# Patient Record
Sex: Female | Born: 1960 | Race: Black or African American | Hispanic: No | Marital: Married | Smoking: Never smoker
Health system: Southern US, Community
[De-identification: ages and names within clinical notes are randomized; demographics above are authoritative.]

## PROBLEM LIST (undated history)

## (undated) ENCOUNTER — Ambulatory Visit: Admission: EM | Payer: BC Managed Care – PPO | Source: Home / Self Care

## (undated) DIAGNOSIS — I491 Atrial premature depolarization: Secondary | ICD-10-CM

## (undated) DIAGNOSIS — G4451 Hemicrania continua: Secondary | ICD-10-CM

## (undated) DIAGNOSIS — I493 Ventricular premature depolarization: Secondary | ICD-10-CM

## (undated) DIAGNOSIS — E079 Disorder of thyroid, unspecified: Secondary | ICD-10-CM

## (undated) DIAGNOSIS — I471 Supraventricular tachycardia, unspecified: Secondary | ICD-10-CM

## (undated) DIAGNOSIS — E785 Hyperlipidemia, unspecified: Secondary | ICD-10-CM

## (undated) DIAGNOSIS — Z460 Encounter for fitting and adjustment of spectacles and contact lenses: Secondary | ICD-10-CM

## (undated) DIAGNOSIS — R51 Headache: Secondary | ICD-10-CM

## (undated) DIAGNOSIS — E0590 Thyrotoxicosis, unspecified without thyrotoxic crisis or storm: Secondary | ICD-10-CM

## (undated) DIAGNOSIS — D509 Iron deficiency anemia, unspecified: Secondary | ICD-10-CM

## (undated) DIAGNOSIS — D219 Benign neoplasm of connective and other soft tissue, unspecified: Secondary | ICD-10-CM

## (undated) DIAGNOSIS — E669 Obesity, unspecified: Secondary | ICD-10-CM

## (undated) DIAGNOSIS — S161XXA Strain of muscle, fascia and tendon at neck level, initial encounter: Secondary | ICD-10-CM

## (undated) DIAGNOSIS — E559 Vitamin D deficiency, unspecified: Secondary | ICD-10-CM

## (undated) DIAGNOSIS — Z789 Other specified health status: Secondary | ICD-10-CM

## (undated) HISTORY — DX: Benign neoplasm of connective and other soft tissue, unspecified: D21.9

## (undated) HISTORY — DX: Hemicrania continua: G44.51

## (undated) HISTORY — DX: Iron deficiency anemia, unspecified: D50.9

## (undated) HISTORY — DX: Supraventricular tachycardia: I47.1

## (undated) HISTORY — DX: Vitamin D deficiency, unspecified: E55.9

## (undated) HISTORY — PX: TUBAL LIGATION: SHX77

## (undated) HISTORY — DX: Obesity, unspecified: E66.9

## (undated) HISTORY — DX: Disorder of thyroid, unspecified: E07.9

## (undated) HISTORY — DX: Hyperlipidemia, unspecified: E78.5

## (undated) HISTORY — DX: Thyrotoxicosis, unspecified without thyrotoxic crisis or storm: E05.90

## (undated) HISTORY — DX: Ventricular premature depolarization: I49.3

## (undated) HISTORY — DX: Atrial premature depolarization: I49.1

## (undated) HISTORY — DX: Supraventricular tachycardia, unspecified: I47.10

## (undated) HISTORY — PX: GANGLION CYST EXCISION: SHX1691

---

## 1998-01-16 ENCOUNTER — Other Ambulatory Visit: Admission: RE | Admit: 1998-01-16 | Discharge: 1998-01-16 | Payer: Self-pay | Admitting: *Deleted

## 1998-01-23 ENCOUNTER — Other Ambulatory Visit: Admission: RE | Admit: 1998-01-23 | Discharge: 1998-01-23 | Payer: Self-pay | Admitting: *Deleted

## 1998-02-13 ENCOUNTER — Other Ambulatory Visit: Admission: RE | Admit: 1998-02-13 | Discharge: 1998-02-13 | Payer: Self-pay | Admitting: *Deleted

## 1998-04-29 ENCOUNTER — Encounter: Admission: RE | Admit: 1998-04-29 | Discharge: 1998-04-29 | Payer: Self-pay | Admitting: *Deleted

## 1998-12-23 ENCOUNTER — Other Ambulatory Visit: Admission: RE | Admit: 1998-12-23 | Discharge: 1998-12-23 | Payer: Self-pay | Admitting: *Deleted

## 1999-04-06 ENCOUNTER — Emergency Department (HOSPITAL_COMMUNITY): Admission: EM | Admit: 1999-04-06 | Discharge: 1999-04-06 | Payer: Self-pay | Admitting: Emergency Medicine

## 1999-04-06 ENCOUNTER — Encounter: Payer: Self-pay | Admitting: Emergency Medicine

## 1999-04-13 ENCOUNTER — Encounter: Payer: Self-pay | Admitting: *Deleted

## 1999-04-13 ENCOUNTER — Ambulatory Visit (HOSPITAL_COMMUNITY): Admission: RE | Admit: 1999-04-13 | Discharge: 1999-04-13 | Payer: Self-pay | Admitting: *Deleted

## 2000-02-08 ENCOUNTER — Other Ambulatory Visit: Admission: RE | Admit: 2000-02-08 | Discharge: 2000-02-08 | Payer: Self-pay | Admitting: *Deleted

## 2000-06-05 ENCOUNTER — Emergency Department (HOSPITAL_COMMUNITY): Admission: EM | Admit: 2000-06-05 | Discharge: 2000-06-05 | Payer: Self-pay | Admitting: Emergency Medicine

## 2000-11-25 ENCOUNTER — Emergency Department (HOSPITAL_COMMUNITY): Admission: EM | Admit: 2000-11-25 | Discharge: 2000-11-25 | Payer: Self-pay | Admitting: *Deleted

## 2001-01-24 ENCOUNTER — Ambulatory Visit (HOSPITAL_COMMUNITY): Admission: RE | Admit: 2001-01-24 | Discharge: 2001-01-24 | Payer: Self-pay | Admitting: *Deleted

## 2001-01-24 ENCOUNTER — Encounter: Payer: Self-pay | Admitting: *Deleted

## 2001-04-05 ENCOUNTER — Other Ambulatory Visit: Admission: RE | Admit: 2001-04-05 | Discharge: 2001-04-05 | Payer: Self-pay | Admitting: *Deleted

## 2002-01-31 ENCOUNTER — Encounter: Payer: Self-pay | Admitting: *Deleted

## 2002-01-31 ENCOUNTER — Ambulatory Visit (HOSPITAL_COMMUNITY): Admission: RE | Admit: 2002-01-31 | Discharge: 2002-01-31 | Payer: Self-pay | Admitting: *Deleted

## 2002-04-08 ENCOUNTER — Other Ambulatory Visit: Admission: RE | Admit: 2002-04-08 | Discharge: 2002-04-08 | Payer: Self-pay | Admitting: *Deleted

## 2002-09-05 HISTORY — PX: SHOULDER ARTHROSCOPY: SHX128

## 2003-04-04 ENCOUNTER — Ambulatory Visit (HOSPITAL_BASED_OUTPATIENT_CLINIC_OR_DEPARTMENT_OTHER): Admission: RE | Admit: 2003-04-04 | Discharge: 2003-04-04 | Payer: Self-pay | Admitting: Orthopedic Surgery

## 2003-04-08 ENCOUNTER — Ambulatory Visit (HOSPITAL_COMMUNITY): Admission: RE | Admit: 2003-04-08 | Discharge: 2003-04-08 | Payer: Self-pay | Admitting: *Deleted

## 2003-04-08 ENCOUNTER — Encounter: Payer: Self-pay | Admitting: *Deleted

## 2003-06-03 ENCOUNTER — Other Ambulatory Visit: Admission: RE | Admit: 2003-06-03 | Discharge: 2003-06-03 | Payer: Self-pay | Admitting: Obstetrics and Gynecology

## 2004-04-19 ENCOUNTER — Ambulatory Visit (HOSPITAL_COMMUNITY): Admission: RE | Admit: 2004-04-19 | Discharge: 2004-04-19 | Payer: Self-pay | Admitting: Obstetrics and Gynecology

## 2004-08-08 ENCOUNTER — Emergency Department (HOSPITAL_COMMUNITY): Admission: EM | Admit: 2004-08-08 | Discharge: 2004-08-08 | Payer: Self-pay | Admitting: Family Medicine

## 2004-12-10 ENCOUNTER — Emergency Department (HOSPITAL_COMMUNITY): Admission: EM | Admit: 2004-12-10 | Discharge: 2004-12-10 | Payer: Self-pay | Admitting: Family Medicine

## 2005-04-07 ENCOUNTER — Emergency Department (HOSPITAL_COMMUNITY): Admission: EM | Admit: 2005-04-07 | Discharge: 2005-04-07 | Payer: Self-pay | Admitting: Emergency Medicine

## 2005-04-21 ENCOUNTER — Ambulatory Visit (HOSPITAL_COMMUNITY): Admission: RE | Admit: 2005-04-21 | Discharge: 2005-04-21 | Payer: Self-pay | Admitting: Obstetrics and Gynecology

## 2005-09-02 ENCOUNTER — Encounter: Admission: RE | Admit: 2005-09-02 | Discharge: 2005-09-02 | Payer: Self-pay | Admitting: Obstetrics and Gynecology

## 2006-01-06 ENCOUNTER — Emergency Department (HOSPITAL_COMMUNITY): Admission: EM | Admit: 2006-01-06 | Discharge: 2006-01-06 | Payer: Self-pay | Admitting: Family Medicine

## 2006-06-10 ENCOUNTER — Emergency Department (HOSPITAL_COMMUNITY): Admission: EM | Admit: 2006-06-10 | Discharge: 2006-06-10 | Payer: Self-pay | Admitting: Emergency Medicine

## 2006-06-21 ENCOUNTER — Emergency Department (HOSPITAL_COMMUNITY): Admission: EM | Admit: 2006-06-21 | Discharge: 2006-06-21 | Payer: Self-pay | Admitting: Family Medicine

## 2006-09-14 ENCOUNTER — Ambulatory Visit (HOSPITAL_COMMUNITY): Admission: RE | Admit: 2006-09-14 | Discharge: 2006-09-14 | Payer: Self-pay | Admitting: Obstetrics and Gynecology

## 2007-04-11 ENCOUNTER — Other Ambulatory Visit: Admission: RE | Admit: 2007-04-11 | Discharge: 2007-04-11 | Payer: Self-pay | Admitting: *Deleted

## 2007-05-27 ENCOUNTER — Emergency Department (HOSPITAL_COMMUNITY): Admission: EM | Admit: 2007-05-27 | Discharge: 2007-05-27 | Payer: Self-pay | Admitting: Family Medicine

## 2007-07-06 ENCOUNTER — Emergency Department (HOSPITAL_COMMUNITY): Admission: EM | Admit: 2007-07-06 | Discharge: 2007-07-06 | Payer: Self-pay | Admitting: Family Medicine

## 2007-10-01 ENCOUNTER — Ambulatory Visit (HOSPITAL_COMMUNITY): Admission: RE | Admit: 2007-10-01 | Discharge: 2007-10-01 | Payer: Self-pay | Admitting: *Deleted

## 2007-12-24 ENCOUNTER — Emergency Department (HOSPITAL_COMMUNITY): Admission: EM | Admit: 2007-12-24 | Discharge: 2007-12-24 | Payer: Self-pay | Admitting: Emergency Medicine

## 2008-05-21 ENCOUNTER — Other Ambulatory Visit: Admission: RE | Admit: 2008-05-21 | Discharge: 2008-05-21 | Payer: Self-pay | Admitting: Family Medicine

## 2008-10-02 ENCOUNTER — Ambulatory Visit (HOSPITAL_COMMUNITY): Admission: RE | Admit: 2008-10-02 | Discharge: 2008-10-02 | Payer: Self-pay | Admitting: Family Medicine

## 2009-06-08 ENCOUNTER — Other Ambulatory Visit: Admission: RE | Admit: 2009-06-08 | Discharge: 2009-06-08 | Payer: Self-pay | Admitting: Family Medicine

## 2009-10-14 ENCOUNTER — Ambulatory Visit (HOSPITAL_COMMUNITY): Admission: RE | Admit: 2009-10-14 | Discharge: 2009-10-14 | Payer: Self-pay | Admitting: Family Medicine

## 2010-06-18 ENCOUNTER — Other Ambulatory Visit: Admission: RE | Admit: 2010-06-18 | Discharge: 2010-06-18 | Payer: Self-pay | Admitting: Family Medicine

## 2010-10-11 ENCOUNTER — Other Ambulatory Visit: Payer: Self-pay | Admitting: Family Medicine

## 2010-10-11 DIAGNOSIS — Z1239 Encounter for other screening for malignant neoplasm of breast: Secondary | ICD-10-CM

## 2010-10-11 DIAGNOSIS — Z1231 Encounter for screening mammogram for malignant neoplasm of breast: Secondary | ICD-10-CM

## 2010-10-21 ENCOUNTER — Ambulatory Visit: Payer: Self-pay

## 2010-10-28 ENCOUNTER — Ambulatory Visit (HOSPITAL_COMMUNITY): Payer: BC Managed Care – PPO | Attending: Family Medicine

## 2010-11-22 ENCOUNTER — Other Ambulatory Visit (HOSPITAL_COMMUNITY): Payer: Self-pay | Admitting: Family Medicine

## 2010-11-22 DIAGNOSIS — Z1231 Encounter for screening mammogram for malignant neoplasm of breast: Secondary | ICD-10-CM

## 2010-12-01 ENCOUNTER — Ambulatory Visit (HOSPITAL_COMMUNITY): Payer: BC Managed Care – PPO

## 2010-12-08 ENCOUNTER — Ambulatory Visit (HOSPITAL_COMMUNITY)
Admission: RE | Admit: 2010-12-08 | Discharge: 2010-12-08 | Disposition: A | Discharge status: Home / Self Care | Payer: BC Managed Care – PPO | Source: Ambulatory Visit | Attending: Family Medicine | Admitting: Family Medicine

## 2010-12-08 DIAGNOSIS — Z1231 Encounter for screening mammogram for malignant neoplasm of breast: Secondary | ICD-10-CM

## 2011-01-21 NOTE — Op Note (Signed)
NAME:  Yvette Stanton, MEDICO                         ACCOUNT NO.:  [id number]   MEDICAL RECORD NO.:  [id number]                   PATIENT TYPE:  OUT   LOCATION:  [facility]                                 FACILITY:  [facility]   PHYSICIAN:  Harvie Junior, M.D.                DATE OF BIRTH:  05-02-1961   DATE OF PROCEDURE:  04/04/2003  DATE OF DISCHARGE:                                 OPERATIVE REPORT   PREOPERATIVE DIAGNOSIS:  Impingement right shoulder.   POSTOPERATIVE DIAGNOSES:  1. Impingement right shoulder.  2. Partial thickness undersurface rotator cuff tear.   OPERATION:  1. Anterior lateral acromioplasty.  2. Distal clavicle excision by way of coplaneing.  3. Debridement of the glenohumeral joint of a partial thickness rotator cuff     tear.   SURGEON:  Harvie Junior, M.D.   ASSISTANT:  Marshia Ly, P.A.   ANESTHESIA:  General.   BRIEF HISTORY:  The patient is a 50 year old female with a long history of  having right shoulder pain.  She has been treated conservatively for a  prolonged period of time but continued to have complaints of pain with over  head elevation and cross body adduction.  We treated her with physical  therapy and anti-inflammatory medications.  Injection therapy worked  Adult nurse for her but her pain recurred.  Because of the continued  complaint of pain and failure to conservative care she is taken to the  operating room for acromioplasty and evaluation of the shoulder.   DESCRIPTION OF PROCEDURE:  The patient was taken to the operating room and  after adequate anesthesia obtained with a general anesthetic, the patient  was placed in the beach chair positioner.  All bony prominences were well  padded.  Attention was then turned to the right shoulder.  After routine  prepping and draping, standard arthroscopic examination revealed that there  was an undersurface tear of the rotator cuff at the supraspinatus.  A  motorized shaver was introduced into the  glenohumeral joint at this point  and the undersurface of the rotator cuff was debrided.  The biceps tendon  was identified and noted within normal limits.  There was a little bit of  posterior labral fraying which was debrided.  The glenohumeral joint was  evaluated and noted to have no abnormality and after debridement of the  rotator cuff, the rotator cuff was evaluated thoroughly and was noted to be  within normal limits.  Attention at this time was turned out of the  glenohumeral joint into the subacromial space where a large subacromial spur  was encountered.  The anterolateral acromioplasty was performed as well as a  bursectomy.  The distal clavicle was coplaned up to the level of the  acromioplasty.  At this point, the rotator cuff was evaluated thoroughly  from the superior surface and noted to have no evidence of abnormality.  A  generous bursectomy  was performed from the superior surface.  At this time,  a final check was made for any loose fragmented pieces of bursal tissue or  periosteum.  This was debrided with a suction shaver from within the  superior surface and then the patient was taken to the recovery room where  she was noted to be in satisfactory condition after her portals were closed  with a bandage and a sterile compressive dressing had been applied.  There  were no complications with the procedure and the patient was taken to the  recovery room where she was noted to be in satisfactory condition.  Estimated blood loss for the procedure was none.                                               Harvie Junior, M.D.    Ranae Plumber  D:  04/04/2003  T:  04/04/2003  Job:  [id number]

## 2011-04-22 ENCOUNTER — Inpatient Hospital Stay (INDEPENDENT_AMBULATORY_CARE_PROVIDER_SITE_OTHER)
Admission: RE | Admit: 2011-04-22 | Discharge: 2011-04-22 | Disposition: A | Discharge status: Home / Self Care | Payer: BC Managed Care – PPO | Source: Ambulatory Visit | Attending: Emergency Medicine | Admitting: Emergency Medicine

## 2011-04-22 DIAGNOSIS — S335XXA Sprain of ligaments of lumbar spine, initial encounter: Secondary | ICD-10-CM

## 2011-04-22 DIAGNOSIS — M549 Dorsalgia, unspecified: Secondary | ICD-10-CM

## 2011-04-22 LAB — POCT URINALYSIS DIP (DEVICE)
Bilirubin Urine: NEGATIVE
Glucose, UA: NEGATIVE mg/dL
Hgb urine dipstick: NEGATIVE
Ketones, ur: NEGATIVE mg/dL
Leukocytes, UA: NEGATIVE
Nitrite: NEGATIVE
Protein, ur: NEGATIVE mg/dL
Specific Gravity, Urine: 1.02 (ref 1.005–1.030)
Urobilinogen, UA: 0.2 mg/dL (ref 0.0–1.0)
pH: 7.5 (ref 5.0–8.0)

## 2011-06-15 LAB — POCT URINALYSIS DIP (DEVICE)
Bilirubin Urine: NEGATIVE
Glucose, UA: NEGATIVE
Ketones, ur: NEGATIVE
Nitrite: NEGATIVE
Operator id: 116391
Protein, ur: NEGATIVE
Specific Gravity, Urine: 1.015
Urobilinogen, UA: 0.2
pH: 6.5

## 2011-06-16 LAB — POCT URINALYSIS DIP (DEVICE)
Bilirubin Urine: NEGATIVE
Glucose, UA: NEGATIVE
Ketones, ur: NEGATIVE
Nitrite: NEGATIVE
Operator id: 208841
Protein, ur: NEGATIVE
Specific Gravity, Urine: 1.01
Urobilinogen, UA: 0.2
pH: 7.5

## 2011-06-16 LAB — URINE CULTURE: Colony Count: 1000

## 2011-12-12 ENCOUNTER — Other Ambulatory Visit (HOSPITAL_COMMUNITY): Payer: Self-pay | Admitting: Family Medicine

## 2011-12-12 DIAGNOSIS — Z1231 Encounter for screening mammogram for malignant neoplasm of breast: Secondary | ICD-10-CM

## 2011-12-24 ENCOUNTER — Encounter (HOSPITAL_COMMUNITY): Payer: Self-pay

## 2011-12-24 ENCOUNTER — Emergency Department (HOSPITAL_COMMUNITY)
Admission: EM | Admit: 2011-12-24 | Discharge: 2011-12-24 | Disposition: A | Discharge status: Home / Self Care | Payer: BC Managed Care – PPO | Source: Home / Self Care | Attending: Emergency Medicine | Admitting: Emergency Medicine

## 2011-12-24 DIAGNOSIS — B9789 Other viral agents as the cause of diseases classified elsewhere: Secondary | ICD-10-CM

## 2011-12-24 DIAGNOSIS — B349 Viral infection, unspecified: Secondary | ICD-10-CM

## 2011-12-24 LAB — POCT URINALYSIS DIP (DEVICE)
Glucose, UA: NEGATIVE mg/dL
Ketones, ur: 40 mg/dL — AB
Leukocytes, UA: NEGATIVE
Nitrite: NEGATIVE
Protein, ur: 30 mg/dL — AB
Specific Gravity, Urine: 1.02 (ref 1.005–1.030)
Urobilinogen, UA: 1 mg/dL (ref 0.0–1.0)
pH: 6.5 (ref 5.0–8.0)

## 2011-12-24 MED ORDER — IBUPROFEN 800 MG PO TABS
ORAL_TABLET | ORAL | Status: AC
  Filled 2011-12-24: qty 1

## 2011-12-24 MED ORDER — IBUPROFEN 800 MG PO TABS
800.0000 mg | ORAL_TABLET | Freq: Once | ORAL | Status: AC
  Administered 2011-12-24: 800 mg via ORAL

## 2011-12-24 NOTE — ED Provider Notes (Signed)
Chief Complaint  Patient presents with  . Fever    History of Present Illness:   The patient is a 51 year old female who had a two-day history of a low-grade temperature up to 100.8, chills, sweats, aches, scratchy throat, and headache. She denies any nasal congestion, rhinorrhea, adenopathy, or stiff neck. She's had no coughing, wheezing, shortness of breath, or chest pain. She denies any abdominal pain, nausea, vomiting, or diarrhea. She had no urinary symptoms. She denies any GYN complaints. No history of a tick bite or skin rash. No recent travel, and will exposure, or any other suspicious exposures.  Review of Systems:  Other than noted above, the patient denies any of the following symptoms. Systemic:  No fever, chills, sweats, fatigue, myalgias, headache, or anorexia. Eye:  No redness, pain or drainage. ENT:  No earache, nasal congestion, rhinorrhea, sinus pressure, or sore throat. Lungs:  No cough, sputum production, wheezing, shortness of breath.  Cardiovascular:  No chest pain, palpitations, or syncope. GI:  No nausea, vomiting, abdominal pain or diarrhea. GU:  No dysuria, frequency, or hematuria. Skin:  No rash or pruritis.  PMFSH:  Past medical history, family history, social history, meds, and allergies were reviewed.  Physical Exam:   Vital signs:  BP 121/79  Pulse 110  Temp(Src) 100.8 F (38.2 C) (Oral)  Resp 20  SpO2 97%  LMP 12/19/2011 General:  Alert, in no distress. Eye:  PERRL, full EOMs.  Lids and conjunctivas were normal. ENT:  TMs and canals were normal, without erythema or inflammation.  Nasal mucosa was clear and uncongested, without drainage.  Mucous membranes were moist.  Pharynx was clear, without exudate or drainage.  There were no oral ulcerations or lesions. Neck:  Supple, no adenopathy, tenderness or mass. Thyroid was normal. Lungs:  No respiratory distress.  Lungs were clear to auscultation, without wheezes, rales or rhonchi.  Breath sounds were clear  and equal bilaterally. Heart:  Regular rhythm, without gallops, murmers or rubs. Abdomen:  Soft, flat, and non-tender to palpation.  No hepatosplenomagaly or mass. Skin:  Clear, warm, and dry, without rash or lesions.  Labs:   Results for orders placed during the hospital encounter of 12/24/11  POCT URINALYSIS DIP (DEVICE)      Component Value Range   Glucose, UA NEGATIVE  NEGATIVE (mg/dL)   Bilirubin Urine SMALL (*) NEGATIVE    Ketones, ur 40 (*) NEGATIVE (mg/dL)   Specific Gravity, Urine 1.020  1.005 - 1.030    Hgb urine dipstick TRACE (*) NEGATIVE    pH 6.5  5.0 - 8.0    Protein, ur 30 (*) NEGATIVE (mg/dL)   Urobilinogen, UA 1.0  0.0 - 1.0 (mg/dL)   Nitrite NEGATIVE  NEGATIVE    Leukocytes, UA NEGATIVE  NEGATIVE     Assessment:  The encounter diagnosis was Viral syndrome.  Plan:   1.  The following meds were prescribed:   New Prescriptions   No medications on file   2.  The patient was instructed in symptomatic care and handouts were given. 3.  The patient was told to return if becoming worse in any way, if no better in 3 or 4 days, and given some red flag symptoms that would indicate earlier return.    Reuben Likes, MD 12/24/11 [id number]

## 2011-12-24 NOTE — ED Notes (Signed)
Pt has been achy and fever since yesterday, she was also sick last weekend with similar symptoms.

## 2011-12-24 NOTE — Discharge Instructions (Signed)

## 2012-01-06 ENCOUNTER — Ambulatory Visit (HOSPITAL_COMMUNITY)
Admission: RE | Admit: 2012-01-06 | Discharge: 2012-01-06 | Disposition: A | Discharge status: Home / Self Care | Payer: BC Managed Care – PPO | Source: Ambulatory Visit | Attending: Family Medicine | Admitting: Family Medicine

## 2012-01-06 DIAGNOSIS — Z1231 Encounter for screening mammogram for malignant neoplasm of breast: Secondary | ICD-10-CM

## 2012-04-08 ENCOUNTER — Encounter (HOSPITAL_COMMUNITY): Payer: Self-pay | Admitting: *Deleted

## 2012-04-08 ENCOUNTER — Emergency Department (INDEPENDENT_AMBULATORY_CARE_PROVIDER_SITE_OTHER)
Admission: EM | Admit: 2012-04-08 | Discharge: 2012-04-08 | Disposition: A | Discharge status: Home / Self Care | Payer: BC Managed Care – PPO | Source: Home / Self Care | Attending: Emergency Medicine | Admitting: Emergency Medicine

## 2012-04-08 DIAGNOSIS — R51 Headache: Secondary | ICD-10-CM

## 2012-04-08 HISTORY — DX: Strain of muscle, fascia and tendon at neck level, initial encounter: S16.1XXA

## 2012-04-08 HISTORY — DX: Headache: R51

## 2012-04-08 MED ORDER — BUTALBITAL-APAP-CAFFEINE 50-325-40 MG PO TABS: 1.0000 | ORAL_TABLET | Freq: Four times a day (QID) | ORAL | Status: AC | PRN

## 2012-04-08 MED ORDER — KETOROLAC TROMETHAMINE 60 MG/2ML IM SOLN
INTRAMUSCULAR | Status: AC
  Filled 2012-04-08: qty 2

## 2012-04-08 MED ORDER — IBUPROFEN 800 MG PO TABS: 800.0000 mg | ORAL_TABLET | Freq: Three times a day (TID) | ORAL | Status: AC

## 2012-04-08 MED ORDER — KETOROLAC TROMETHAMINE 30 MG/ML IJ SOLN
60.0000 mg | Freq: Once | INTRAMUSCULAR | Status: AC
  Administered 2012-04-08: 60 mg via INTRAMUSCULAR
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
## 2012-04-08 NOTE — ED Notes (Signed)
Pt with throbbing headache right side of head onset x one week - worse today - taking otc medications without relief - chronic headaches worse last few days

## 2012-05-10 DIAGNOSIS — R519 Headache, unspecified: Secondary | ICD-10-CM | POA: Insufficient documentation

## 2012-12-04 ENCOUNTER — Other Ambulatory Visit: Payer: Self-pay | Admitting: Family Medicine

## 2012-12-04 DIAGNOSIS — N920 Excessive and frequent menstruation with regular cycle: Secondary | ICD-10-CM

## 2012-12-10 ENCOUNTER — Ambulatory Visit
Admission: RE | Admit: 2012-12-10 | Discharge: 2012-12-10 | Disposition: A | Discharge status: Home / Self Care | Payer: BC Managed Care – PPO | Source: Ambulatory Visit | Attending: Family Medicine | Admitting: Family Medicine

## 2012-12-10 DIAGNOSIS — N920 Excessive and frequent menstruation with regular cycle: Secondary | ICD-10-CM

## 2013-01-04 DIAGNOSIS — F439 Reaction to severe stress, unspecified: Secondary | ICD-10-CM | POA: Insufficient documentation

## 2013-01-21 ENCOUNTER — Other Ambulatory Visit (HOSPITAL_COMMUNITY): Payer: Self-pay | Admitting: Family Medicine

## 2013-01-21 DIAGNOSIS — Z1231 Encounter for screening mammogram for malignant neoplasm of breast: Secondary | ICD-10-CM

## 2013-01-23 ENCOUNTER — Ambulatory Visit (HOSPITAL_COMMUNITY): Payer: BC Managed Care – PPO | Attending: Family Medicine

## 2013-01-29 ENCOUNTER — Other Ambulatory Visit (HOSPITAL_COMMUNITY)
Admission: RE | Admit: 2013-01-29 | Discharge: 2013-01-29 | Disposition: A | Discharge status: Home / Self Care | Payer: BC Managed Care – PPO | Source: Ambulatory Visit | Attending: Obstetrics and Gynecology | Admitting: Obstetrics and Gynecology

## 2013-01-29 ENCOUNTER — Other Ambulatory Visit: Payer: Self-pay | Admitting: Obstetrics and Gynecology

## 2013-01-29 DIAGNOSIS — Z1151 Encounter for screening for human papillomavirus (HPV): Secondary | ICD-10-CM | POA: Insufficient documentation

## 2013-01-29 DIAGNOSIS — Z01419 Encounter for gynecological examination (general) (routine) without abnormal findings: Secondary | ICD-10-CM | POA: Insufficient documentation

## 2013-02-01 ENCOUNTER — Ambulatory Visit (HOSPITAL_COMMUNITY): Payer: BC Managed Care – PPO

## 2013-02-07 ENCOUNTER — Ambulatory Visit (HOSPITAL_COMMUNITY)
Admission: RE | Admit: 2013-02-07 | Discharge: 2013-02-07 | Disposition: A | Discharge status: Home / Self Care | Payer: BC Managed Care – PPO | Source: Ambulatory Visit | Attending: Family Medicine | Admitting: Family Medicine

## 2013-02-07 DIAGNOSIS — Z1231 Encounter for screening mammogram for malignant neoplasm of breast: Secondary | ICD-10-CM | POA: Insufficient documentation

## 2013-09-27 ENCOUNTER — Telehealth: Payer: Self-pay | Admitting: Interventional Cardiology

## 2013-09-27 NOTE — Telephone Encounter (Signed)
Called pt back and went over her echo results that were normal and heart monitor that showed PVC's.

## 2013-09-27 NOTE — Telephone Encounter (Signed)
New problem:  Pt is requesting a call back from the nurse.. States she saw Dr. Irish Lack a few years ago and wants to know her diagnosis.

## 2013-09-27 NOTE — Telephone Encounter (Signed)
Follow up ° ° ° ° °Returning a nurses call °

## 2013-10-18 DIAGNOSIS — M25569 Pain in unspecified knee: Secondary | ICD-10-CM | POA: Insufficient documentation

## 2013-10-19 ENCOUNTER — Encounter (HOSPITAL_COMMUNITY): Payer: Self-pay | Admitting: Emergency Medicine

## 2013-10-19 ENCOUNTER — Emergency Department (HOSPITAL_COMMUNITY)
Admission: EM | Admit: 2013-10-19 | Discharge: 2013-10-19 | Disposition: A | Discharge status: Home / Self Care | Payer: BC Managed Care – PPO | Source: Home / Self Care | Attending: Family Medicine | Admitting: Family Medicine

## 2013-10-19 DIAGNOSIS — M2669 Other specified disorders of temporomandibular joint: Secondary | ICD-10-CM

## 2013-10-19 DIAGNOSIS — M26629 Arthralgia of temporomandibular joint, unspecified side: Secondary | ICD-10-CM
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
## 2013-10-19 NOTE — ED Notes (Signed)
Ear pain x 1 week, minimal relief w home remedies. Asking about flu shot for this season. Was advised by her daughter it might be a good idea

## 2013-10-31 ENCOUNTER — Encounter (HOSPITAL_BASED_OUTPATIENT_CLINIC_OR_DEPARTMENT_OTHER): Payer: Self-pay | Admitting: *Deleted

## 2013-10-31 NOTE — Progress Notes (Signed)
Does not take any meds

## 2013-11-04 ENCOUNTER — Other Ambulatory Visit: Payer: Self-pay | Admitting: Orthopedic Surgery

## 2013-11-06 ENCOUNTER — Encounter (HOSPITAL_BASED_OUTPATIENT_CLINIC_OR_DEPARTMENT_OTHER): Payer: Self-pay | Admitting: *Deleted

## 2013-11-06 ENCOUNTER — Encounter (HOSPITAL_BASED_OUTPATIENT_CLINIC_OR_DEPARTMENT_OTHER)
Admission: RE | Disposition: A | Discharge status: Home / Self Care | Payer: Self-pay | Source: Ambulatory Visit | Attending: Orthopedic Surgery

## 2013-11-06 ENCOUNTER — Ambulatory Visit (HOSPITAL_BASED_OUTPATIENT_CLINIC_OR_DEPARTMENT_OTHER): Payer: BC Managed Care – PPO | Admitting: Anesthesiology

## 2013-11-06 ENCOUNTER — Encounter (HOSPITAL_BASED_OUTPATIENT_CLINIC_OR_DEPARTMENT_OTHER): Payer: BC Managed Care – PPO | Admitting: Anesthesiology

## 2013-11-06 ENCOUNTER — Ambulatory Visit (HOSPITAL_BASED_OUTPATIENT_CLINIC_OR_DEPARTMENT_OTHER)
Admission: RE | Admit: 2013-11-06 | Discharge: 2013-11-06 | Disposition: A | Discharge status: Home / Self Care | Payer: BC Managed Care – PPO | Source: Ambulatory Visit | Attending: Orthopedic Surgery | Admitting: Orthopedic Surgery

## 2013-11-06 DIAGNOSIS — M23302 Other meniscus derangements, unspecified lateral meniscus, unspecified knee: Secondary | ICD-10-CM | POA: Insufficient documentation

## 2013-11-06 DIAGNOSIS — M2240 Chondromalacia patellae, unspecified knee: Secondary | ICD-10-CM | POA: Insufficient documentation

## 2013-11-06 DIAGNOSIS — M6750 Plica syndrome, unspecified knee: Secondary | ICD-10-CM | POA: Insufficient documentation

## 2013-11-06 HISTORY — PX: KNEE ARTHROSCOPY: SHX127

## 2013-11-06 HISTORY — DX: Encounter for fitting and adjustment of spectacles and contact lenses: Z46.0

## 2013-11-06 HISTORY — DX: Other specified health status: Z78.9

## 2013-11-06 SURGERY — ARTHROSCOPY, KNEE
Anesthesia: General | Site: Knee | Laterality: Left

## 2013-11-06 MED ORDER — KETOROLAC TROMETHAMINE 30 MG/ML IJ SOLN: 15.0000 mg | Freq: Once | INTRAMUSCULAR | Status: DC | PRN

## 2013-11-06 MED ORDER — OXYCODONE HCL 5 MG/5ML PO SOLN: 5.0000 mg | Freq: Once | ORAL | Status: DC | PRN

## 2013-11-06 MED ORDER — ONDANSETRON HCL 4 MG/2ML IJ SOLN
INTRAMUSCULAR | Status: DC | PRN
  Administered 2013-11-06: 4 mg via INTRAVENOUS

## 2013-11-06 MED ORDER — PROPOFOL 10 MG/ML IV BOLUS
INTRAVENOUS | Status: DC | PRN
  Administered 2013-11-06 (×2): 200 mg via INTRAVENOUS

## 2013-11-06 MED ORDER — ACETAMINOPHEN 160 MG/5ML PO SOLN: 325.0000 mg | ORAL | Status: DC | PRN

## 2013-11-06 MED ORDER — MIDAZOLAM HCL 2 MG/2ML IJ SOLN
INTRAMUSCULAR | Status: AC
  Filled 2013-11-06: qty 2

## 2013-11-06 MED ORDER — DEXAMETHASONE SODIUM PHOSPHATE 4 MG/ML IJ SOLN
INTRAMUSCULAR | Status: DC | PRN
  Administered 2013-11-06: 10 mg via INTRAVENOUS

## 2013-11-06 MED ORDER — SODIUM CHLORIDE 0.9 % IR SOLN
Status: DC | PRN
  Administered 2013-11-06: 3000 mL

## 2013-11-06 MED ORDER — OXYCODONE HCL 5 MG PO TABS: 5.0000 mg | ORAL_TABLET | Freq: Once | ORAL | Status: DC | PRN

## 2013-11-06 MED ORDER — OXYCODONE-ACETAMINOPHEN 5-325 MG PO TABS
1.0000 | ORAL_TABLET | Freq: Four times a day (QID) | ORAL | Status: DC | PRN
Start: 2013-11-06 — End: 2014-11-28

## 2013-11-06 MED ORDER — CEFAZOLIN SODIUM-DEXTROSE 2-3 GM-% IV SOLR
INTRAVENOUS | Status: AC
Start: 2013-11-06 — End: 2013-11-06
  Filled 2013-11-06: qty 50

## 2013-11-06 MED ORDER — ONDANSETRON HCL 4 MG/2ML IJ SOLN: 4.0000 mg | Freq: Once | INTRAMUSCULAR | Status: DC | PRN

## 2013-11-06 MED ORDER — BUPIVACAINE HCL (PF) 0.5 % IJ SOLN
INTRAMUSCULAR | Status: DC | PRN
  Administered 2013-11-06: 20 mL

## 2013-11-06 MED ORDER — CHLORHEXIDINE GLUCONATE 4 % EX LIQD: 60.0000 mL | Freq: Once | CUTANEOUS | Status: DC

## 2013-11-06 MED ORDER — LACTATED RINGERS IV SOLN
INTRAVENOUS | Status: DC
  Administered 2013-11-06: 09:00:00 via INTRAVENOUS

## 2013-11-06 MED ORDER — FENTANYL CITRATE 0.05 MG/ML IJ SOLN
INTRAMUSCULAR | Status: AC
  Filled 2013-11-06: qty 4

## 2013-11-06 MED ORDER — FENTANYL CITRATE 0.05 MG/ML IJ SOLN
INTRAMUSCULAR | Status: DC | PRN
  Administered 2013-11-06: 100 ug via INTRAVENOUS

## 2013-11-06 MED ORDER — MIDAZOLAM HCL 2 MG/2ML IJ SOLN: 1.0000 mg | INTRAMUSCULAR | Status: DC | PRN

## 2013-11-06 MED ORDER — MIDAZOLAM HCL 5 MG/5ML IJ SOLN
INTRAMUSCULAR | Status: DC | PRN
  Administered 2013-11-06: 2 mg via INTRAVENOUS

## 2013-11-06 MED ORDER — ACETAMINOPHEN 325 MG PO TABS: 325.0000 mg | ORAL_TABLET | ORAL | Status: DC | PRN

## 2013-11-06 MED ORDER — LIDOCAINE HCL (CARDIAC) 20 MG/ML IV SOLN
INTRAVENOUS | Status: DC | PRN
  Administered 2013-11-06: 5 mg via INTRAVENOUS

## 2013-11-06 MED ORDER — FENTANYL CITRATE 0.05 MG/ML IJ SOLN: 50.0000 ug | INTRAMUSCULAR | Status: DC | PRN

## 2013-11-06 MED ORDER — BUPIVACAINE HCL (PF) 0.25 % IJ SOLN
INTRAMUSCULAR | Status: AC
  Filled 2013-11-06: qty 60

## 2013-11-06 MED ORDER — CEFAZOLIN SODIUM-DEXTROSE 2-3 GM-% IV SOLR
2.0000 g | INTRAVENOUS | Status: AC
  Administered 2013-11-06: 2 g via INTRAVENOUS

## 2013-11-06 MED ORDER — EPINEPHRINE HCL 1 MG/ML IJ SOLN
INTRAMUSCULAR | Status: DC | PRN
  Administered 2013-11-06: 1 mg

## 2013-11-06 SURGICAL SUPPLY — 38 items
BANDAGE ELASTIC 6 VELCRO ST LF (GAUZE/BANDAGES/DRESSINGS) ×2 IMPLANT
BLADE 4.2CUDA (BLADE) IMPLANT
BLADE GREAT WHITE 4.2 (BLADE) ×2 IMPLANT
CANISTER SUCT 3000ML (MISCELLANEOUS) IMPLANT
CUTTER MENISCUS  4.2MM (BLADE)
CUTTER MENISCUS 4.2MM (BLADE) IMPLANT
DRAPE ARTHROSCOPY W/POUCH 114 (DRAPES) ×2 IMPLANT
DRSG EMULSION OIL 3X3 NADH (GAUZE/BANDAGES/DRESSINGS) ×2 IMPLANT
DURAPREP 26ML APPLICATOR (WOUND CARE) ×2 IMPLANT
ELECT MENISCUS 165MM 90D (ELECTRODE) IMPLANT
ELECT REM PT RETURN 9FT ADLT (ELECTROSURGICAL)
ELECTRODE REM PT RTRN 9FT ADLT (ELECTROSURGICAL) IMPLANT
GLOVE BIOGEL PI IND STRL 8 (GLOVE) ×2 IMPLANT
GLOVE BIOGEL PI INDICATOR 8 (GLOVE) ×2
GLOVE ECLIPSE 6.5 STRL STRAW (GLOVE) ×1 IMPLANT
GLOVE ECLIPSE 7.5 STRL STRAW (GLOVE) ×4 IMPLANT
GOWN STRL REUS W/ TWL LRG LVL3 (GOWN DISPOSABLE) ×1 IMPLANT
GOWN STRL REUS W/ TWL XL LVL3 (GOWN DISPOSABLE) ×1 IMPLANT
GOWN STRL REUS W/TWL LRG LVL3 (GOWN DISPOSABLE) ×2
GOWN STRL REUS W/TWL XL LVL3 (GOWN DISPOSABLE) ×4 IMPLANT
HOLDER KNEE FOAM BLUE (MISCELLANEOUS) ×2 IMPLANT
KNEE WRAP E Z 3 GEL PACK (MISCELLANEOUS) ×2 IMPLANT
MANIFOLD NEPTUNE II (INSTRUMENTS) ×1 IMPLANT
NDL SAFETY ECLIPSE 18X1.5 (NEEDLE) IMPLANT
NEEDLE HYPO 18GX1.5 SHARP (NEEDLE) ×2
PACK ARTHROSCOPY DSU (CUSTOM PROCEDURE TRAY) ×2 IMPLANT
PACK BASIN DAY SURGERY FS (CUSTOM PROCEDURE TRAY) ×2 IMPLANT
PAD CAST 4YDX4 CTTN HI CHSV (CAST SUPPLIES) ×1 IMPLANT
PADDING CAST COTTON 4X4 STRL (CAST SUPPLIES) ×2
PENCIL BUTTON HOLSTER BLD 10FT (ELECTRODE) IMPLANT
SET ARTHROSCOPY TUBING (MISCELLANEOUS) ×2
SET ARTHROSCOPY TUBING LN (MISCELLANEOUS) ×1 IMPLANT
SPONGE GAUZE 4X4 12PLY (GAUZE/BANDAGES/DRESSINGS) ×2 IMPLANT
SUT ETHILON 4 0 PS 2 18 (SUTURE) IMPLANT
SYR 5ML LL (SYRINGE) ×2 IMPLANT
TOWEL OR 17X24 6PK STRL BLUE (TOWEL DISPOSABLE) ×2 IMPLANT
TOWEL OR NON WOVEN STRL DISP B (DISPOSABLE) ×1 IMPLANT
WATER STERILE IRR 1000ML POUR (IV SOLUTION) ×2 IMPLANT

## 2013-11-06 NOTE — Brief Op Note (Signed)
11/06/2013  11:04 AM  PATIENT:  Marland Kitchen Mehlhoff  53 y.o. female  PRE-OPERATIVE DIAGNOSIS:  LATERAL MENISCUS TEAR, LEFT KNEE  POST-OPERATIVE DIAGNOSIS:  LATERAL MENISCUS TEAR, LEFT KNEE  PROCEDURE:  Procedure(s): ARTHROSCOPY KNEE, chondroplasty and medial menisectomy (Left)  SURGEON:  Surgeon(s) and Role:    * Alta Corning, MD - Primary  PHYSICIAN ASSISTANT:   ASSISTANTS: bethune   ANESTHESIA:   general  EBL:  Total I/O In: 750 [I.V.:750] Out: -   BLOOD ADMINISTERED:none  DRAINS: none   LOCAL MEDICATIONS USED:  MARCAINE     SPECIMEN:  No Specimen  DISPOSITION OF SPECIMEN:  N/A  COUNTS:  YES  TOURNIQUET:  * No tourniquets in log *  DICTATION: .Other Dictation: Dictation Number [id number]  PLAN OF CARE: Discharge to home after PACU  PATIENT DISPOSITION:  PACU - hemodynamically stable.   Delay start of Pharmacological VTE agent (>24hrs) due to surgical blood loss or risk of bleeding: no

## 2013-11-06 NOTE — Discharge Instructions (Signed)

## 2013-11-06 NOTE — Anesthesia Preprocedure Evaluation (Signed)
Anesthesia Evaluation  Patient identified by MRN, date of birth, ID band Patient awake    Reviewed: Allergy & Precautions, H&P , NPO status , Patient's Chart, lab work & pertinent test results  History of Anesthesia Complications Negative for: history of anesthetic complications  Airway Mallampati: II TM Distance: >3 FB Neck ROM: Full    Dental  (+) Teeth Intact,    Pulmonary neg pulmonary ROS,  breath sounds clear to auscultation        Cardiovascular negative cardio ROS  Rhythm:Regular Rate:Normal     Neuro/Psych  Headaches, negative psych ROS   GI/Hepatic negative GI ROS, Neg liver ROS,   Endo/Other    Renal/GU negative Renal ROS     Musculoskeletal  (+) Arthritis -,   Abdominal   Peds  Hematology negative hematology ROS (+)   Anesthesia Other Findings   Reproductive/Obstetrics                           Anesthesia Physical Anesthesia Plan  ASA: I  Anesthesia Plan: General   Post-op Pain Management:    Induction: Intravenous  Airway Management Planned: LMA  Additional Equipment: None  Intra-op Plan:   Post-operative Plan: Extubation in OR  Informed Consent: I have reviewed the patients History and Physical, chart, labs and discussed the procedure including the risks, benefits and alternatives for the proposed anesthesia with the patient or authorized representative who has indicated his/her understanding and acceptance.   Dental advisory given  Plan Discussed with: CRNA and Surgeon  Anesthesia Plan Comments:         Anesthesia Quick Evaluation

## 2013-11-06 NOTE — Transfer of Care (Signed)
Immediate Anesthesia Transfer of Care Note  Patient: Yvette Stanton  Procedure(s) Performed: Procedure(s): ARTHROSCOPY KNEE, chondroplasty and medial menisectomy (Left)  Patient Location: PACU  Anesthesia Type:General  Level of Consciousness: sedated  Airway & Oxygen Therapy: Patient Spontanous Breathing and Patient connected to face mask oxygen  Post-op Assessment: Report given to PACU RN and Post -op Vital signs reviewed and stable  Post vital signs: Reviewed and stable  Complications: No apparent anesthesia complications
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
## 2013-11-06 NOTE — Anesthesia Postprocedure Evaluation (Signed)
  Anesthesia Post-op Note  Patient: Yvette Stanton  Procedure(s) Performed: Procedure(s): ARTHROSCOPY KNEE, chondroplasty and medial menisectomy left knee (Left)  Patient Location: PACU  Anesthesia Type:General  Level of Consciousness: awake and alert   Airway and Oxygen Therapy: Patient Spontanous Breathing  Post-op Pain: mild  Post-op Assessment: Post-op Vital signs reviewed, Patient's Cardiovascular Status Stable and Respiratory Function Stable  Post-op Vital Signs: Reviewed  Filed Vitals:   11/06/13 1200  BP: 122/79  Pulse: 62  Temp:   Resp: 17    Complications: No apparent anesthesia complications

## 2013-11-06 NOTE — Anesthesia Procedure Notes (Signed)
Procedure Name: LMA Insertion Date/Time: 11/06/2013 10:30 AM Performed by: Melynda Ripple D Pre-anesthesia Checklist: Patient identified, Emergency Drugs available, Suction available and Patient being monitored Patient Re-evaluated:Patient Re-evaluated prior to inductionOxygen Delivery Method: Circle System Utilized Preoxygenation: Pre-oxygenation with 100% oxygen Intubation Type: IV induction Ventilation: Mask ventilation without difficulty LMA: LMA inserted LMA Size: 4.0 Number of attempts: 1 Airway Equipment and Method: bite block Placement Confirmation: positive ETCO2 Tube secured with: Tape Dental Injury: Teeth and Oropharynx as per pre-operative assessment

## 2013-11-07 ENCOUNTER — Encounter (HOSPITAL_BASED_OUTPATIENT_CLINIC_OR_DEPARTMENT_OTHER): Payer: Self-pay | Admitting: Orthopedic Surgery

## 2013-11-07 LAB — POCT HEMOGLOBIN-HEMACUE: Hemoglobin: 12.8 g/dL (ref 12.0–15.0)

## 2013-11-07 NOTE — Op Note (Signed)
Yvette Stanton, Yvette Stanton               ACCOUNT NO.:  [id number]  MEDICAL RECORD NO.:  [id number]  LOCATION:                                 FACILITY:  PHYSICIAN:  Alta Corning, M.D.   DATE OF BIRTH:  07/01/1961  DATE OF PROCEDURE:  11/06/2013 DATE OF DISCHARGE:  11/06/2013                              OPERATIVE REPORT   PREOPERATIVE DIAGNOSIS:  Lateral meniscal tear.  POSTOPERATIVE DIAGNOSES: 1. Lateral meniscal tear. 2. 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 3. Medial shelf plica.  PROCEDURE: 1. Partial lateral meniscectomy with debridement of the lateral     compartment. 2. Abrasion arthroplasty, medial femoral condyle. 3. Abrasion arthroplasty of patellofemoral joint down to bleeding     bone. 4. Medial shelf plica excision.  SURGEON:  Alta Corning, M.D.  ASSISTANT:  Gary Fleet, P.A.  ANESTHESIA:  General.  BRIEF HISTORY:  [name] is a 53 year old female with a history of having significant complaints of left knee pain.  She had been treated conservatively for a period of time.  MRI showed lateral meniscal tear with chondromalacia in the patellofemoral compartment and in the medial compartment.  She was taken to the operating room for operative knee arthroscopy after failure of all conservative care.  PROCEDURE:  The patient was taken to the operating room.  After adequate anesthesia was obtained with general anesthetic, the patient was placed supine on the operating table.  The left leg was then prepped and draped in usual sterile fashion.  Following this, routine arthroscopic examination of the knee revealed there was an obvious chondromalacia of the medial femoral condyle, which was debrided.  There was a significant lateral meniscal tear, which was debrided back to a smooth and stable rim.  The patellofemoral joint was then identified and debrided down to a bleeding bone.  The medial shelf plica was identified and  debrided back to the capsular rim.  Once this debridement had been undertaken, a final check was made for loose fragment pieces of cartilage, seeing none, the knee was again irrigated and suctioned dry.  All sterile portals were closed with bandage, sterile compressive dressings were applied.  The patient was taken to the recovery room and was noted to be in a satisfactory condition.  Estimated blood loss for the procedure was none.     Alta Corning, M.D.     Corliss Skains  D:  11/06/2013  T:  11/07/2013  Job:  [id number]

## 2013-11-07 NOTE — Addendum Note (Signed)
Addendum created 11/07/13 1151 by Tawni Millers, CRNA   Modules edited: Charges VN

## 2013-11-11 ENCOUNTER — Telehealth: Payer: Self-pay | Admitting: Interventional Cardiology

## 2013-11-11 NOTE — Telephone Encounter (Signed)
New message     Patient calling has questions regarding testing she had done. When should she have her follow up  .

## 2013-11-13 NOTE — Telephone Encounter (Signed)
lmom lettting pt know that all her tests done at [facility] were normal and she only needs to f/u if she has problems.

## 2014-02-04 ENCOUNTER — Other Ambulatory Visit (HOSPITAL_COMMUNITY): Payer: Self-pay | Admitting: Family Medicine

## 2014-02-04 DIAGNOSIS — Z1231 Encounter for screening mammogram for malignant neoplasm of breast: Secondary | ICD-10-CM

## 2014-02-11 ENCOUNTER — Ambulatory Visit (HOSPITAL_COMMUNITY): Payer: BC Managed Care – PPO

## 2014-02-19 ENCOUNTER — Ambulatory Visit (HOSPITAL_COMMUNITY): Payer: BC Managed Care – PPO

## 2014-02-20 ENCOUNTER — Ambulatory Visit (HOSPITAL_COMMUNITY): Payer: BC Managed Care – PPO

## 2014-02-24 ENCOUNTER — Ambulatory Visit (HOSPITAL_COMMUNITY)
Admission: RE | Admit: 2014-02-24 | Discharge: 2014-02-24 | Disposition: A | Discharge status: Home / Self Care | Payer: BC Managed Care – PPO | Source: Ambulatory Visit | Attending: Family Medicine | Admitting: Family Medicine

## 2014-02-24 DIAGNOSIS — Z1231 Encounter for screening mammogram for malignant neoplasm of breast: Secondary | ICD-10-CM | POA: Insufficient documentation

## 2014-03-26 ENCOUNTER — Other Ambulatory Visit: Payer: Self-pay | Admitting: Otolaryngology

## 2014-03-26 DIAGNOSIS — H9319 Tinnitus, unspecified ear: Secondary | ICD-10-CM

## 2014-03-26 DIAGNOSIS — H905 Unspecified sensorineural hearing loss: Secondary | ICD-10-CM

## 2014-03-26 DIAGNOSIS — H903 Sensorineural hearing loss, bilateral: Secondary | ICD-10-CM

## 2014-04-03 ENCOUNTER — Ambulatory Visit
Admission: RE | Admit: 2014-04-03 | Discharge: 2014-04-03 | Disposition: A | Discharge status: Home / Self Care | Payer: BC Managed Care – PPO | Source: Ambulatory Visit | Attending: Otolaryngology | Admitting: Otolaryngology

## 2014-04-03 DIAGNOSIS — H9319 Tinnitus, unspecified ear: Secondary | ICD-10-CM

## 2014-04-03 DIAGNOSIS — H903 Sensorineural hearing loss, bilateral: Secondary | ICD-10-CM

## 2014-04-03 DIAGNOSIS — H905 Unspecified sensorineural hearing loss: Secondary | ICD-10-CM

## 2014-04-03 MED ORDER — GADOBENATE DIMEGLUMINE 529 MG/ML IV SOLN
15.0000 mL | Freq: Once | INTRAVENOUS | Status: AC | PRN
  Administered 2014-04-03: 15 mL via INTRAVENOUS

## 2014-05-14 ENCOUNTER — Other Ambulatory Visit: Payer: Self-pay | Admitting: Otolaryngology

## 2014-05-14 DIAGNOSIS — H7011 Chronic mastoiditis, right ear: Secondary | ICD-10-CM

## 2014-05-20 ENCOUNTER — Other Ambulatory Visit: Payer: BC Managed Care – PPO

## 2014-05-21 ENCOUNTER — Other Ambulatory Visit: Payer: BC Managed Care – PPO

## 2014-05-22 ENCOUNTER — Other Ambulatory Visit: Payer: BC Managed Care – PPO

## 2014-05-26 ENCOUNTER — Other Ambulatory Visit: Payer: BC Managed Care – PPO

## 2014-05-26 ENCOUNTER — Ambulatory Visit
Admission: RE | Admit: 2014-05-26 | Discharge: 2014-05-26 | Disposition: A | Discharge status: Home / Self Care | Payer: BC Managed Care – PPO | Source: Ambulatory Visit | Attending: Otolaryngology | Admitting: Otolaryngology

## 2014-05-26 DIAGNOSIS — H7011 Chronic mastoiditis, right ear: Secondary | ICD-10-CM

## 2014-05-26 MED ORDER — IOHEXOL 300 MG/ML  SOLN
75.0000 mL | Freq: Once | INTRAMUSCULAR | Status: AC | PRN
  Administered 2014-05-26: 75 mL via INTRAVENOUS

## 2014-06-02 DIAGNOSIS — R0683 Snoring: Secondary | ICD-10-CM | POA: Insufficient documentation

## 2014-06-12 DIAGNOSIS — H9041 Sensorineural hearing loss, unilateral, right ear, with unrestricted hearing on the contralateral side: Secondary | ICD-10-CM | POA: Insufficient documentation

## 2014-06-12 DIAGNOSIS — H903 Sensorineural hearing loss, bilateral: Secondary | ICD-10-CM | POA: Insufficient documentation

## 2014-06-12 DIAGNOSIS — H6521 Chronic serous otitis media, right ear: Secondary | ICD-10-CM | POA: Insufficient documentation

## 2014-06-12 DIAGNOSIS — H6520 Chronic serous otitis media, unspecified ear: Secondary | ICD-10-CM | POA: Insufficient documentation

## 2014-06-12 DIAGNOSIS — IMO0001 Reserved for inherently not codable concepts without codable children: Secondary | ICD-10-CM | POA: Insufficient documentation

## 2014-06-14 ENCOUNTER — Ambulatory Visit: Payer: Self-pay | Admitting: Podiatry

## 2014-10-22 ENCOUNTER — Telehealth: Payer: Self-pay | Admitting: Interventional Cardiology

## 2014-10-22 NOTE — Telephone Encounter (Signed)
I left a message for the patient to call. 

## 2014-10-22 NOTE — Telephone Encounter (Signed)
I spoke with the patient. She is wanting to know the findings and recommendations from Dr. Hassell Done last office note. I advised I will have to call [facility] and get her last note and call her back. She is agreeable.

## 2014-10-22 NOTE — Telephone Encounter (Signed)
New Message     Patient would like a copy of her 11/18/2013 record of the visit. Please give patient. Thanks

## 2014-10-24 NOTE — Telephone Encounter (Signed)
Calling wanting to know if we had received records from Dr. Hassell Done last [place].  She thinks it was several yrs ago.  Advised the lady that can get the records from [place] is not here today.  Advised her to call back middle of next week.  She understands and will call back next week.

## 2014-10-24 NOTE — Telephone Encounter (Signed)
Follow Up   Pt wanted to follow up on findings and recommendations from last Ov w/ [place]- diet & exercise etc. Please call back and discuss.

## 2014-10-27 NOTE — Telephone Encounter (Signed)
Left message for Elmyra Ricks at [place] to send records.

## 2014-10-28 ENCOUNTER — Telehealth: Payer: Self-pay | Admitting: Interventional Cardiology

## 2014-10-28 NOTE — Telephone Encounter (Signed)
Called patient, no answer 

## 2014-10-28 NOTE — Telephone Encounter (Signed)
Left message to call back  

## 2014-10-28 NOTE — Telephone Encounter (Signed)
New problem    Previous message was closed that was initially sent concerning this message. Pt wants a call back from nurse when we received her records. She want to make an appt but want to wait until her records are sent from [place]. Please advise pt. She didn't know the last time she saw Dr Irish Lack.

## 2014-10-29 NOTE — Telephone Encounter (Signed)
Attempted to call pt and phone rang with no answer and cut off.  Unable to leave voicemail.

## 2014-10-30 NOTE — Telephone Encounter (Signed)
Called pt and spoke with her briefly. Pt states that she is in class and will call back when she gets out of class.

## 2014-10-31 NOTE — Telephone Encounter (Signed)
Pt returned call from yesterday. Pt states that it has been a couple of years since she seen Dr. Irish Lack but that she was having her records sent to our office because she wanted to know what Dr. Hassell Done suggestions were from her last OV about her palpitations. Informed pt that we did receive her records from [place] but that they have not been scanned into our system yet so I am unable to see them at this time.  Pt states that she has palpitations from time to time but that she was told that this was normal for some people and that it something she has dealt with for years. Pt states that the palpitations have improved a lot since last seen. Pt would like a call back when documents are scanned into system to advise her on what Dr. Hassell Done suggestions were. Will route this message to Dr. Irish Lack for review.

## 2014-11-13 NOTE — Telephone Encounter (Signed)
Called and spoke with pt in regards to information on records from [place] in regards to palpitations. Informed pt that Dr. Irish Lack had suggested she cut back on caffeine and possibly have an event monitor placed if no improvement with decreasing caffeine. Pt states that she has worn an event monitor recently. Pt states that she has not cut back on caffeine but that she will at this time to see if it helps with palpitations. Informed pt to let us know at her scheduled appt in April if this has helped with the palpitations. Pt verbalized understanding and was in agreement with this plan.

## 2014-11-28 ENCOUNTER — Ambulatory Visit (INDEPENDENT_AMBULATORY_CARE_PROVIDER_SITE_OTHER): Payer: BC Managed Care – PPO | Admitting: Cardiology

## 2014-11-28 ENCOUNTER — Encounter: Payer: Self-pay | Admitting: Cardiology

## 2014-11-28 VITALS — BP 110/70 | HR 69 | Ht 64.0 in | Wt 176.8 lb

## 2014-11-28 DIAGNOSIS — R002 Palpitations: Secondary | ICD-10-CM | POA: Diagnosis not present

## 2014-11-28 DIAGNOSIS — R0789 Other chest pain: Secondary | ICD-10-CM

## 2014-11-28 LAB — COMPREHENSIVE METABOLIC PANEL
ALT: 17 U/L (ref 0–35)
AST: 20 U/L (ref 0–37)
Albumin: 4.2 g/dL (ref 3.5–5.2)
Alkaline Phosphatase: 75 U/L (ref 39–117)
BUN: 13 mg/dL (ref 6–23)
CO2: 30 mEq/L (ref 19–32)
Calcium: 9.8 mg/dL (ref 8.4–10.5)
Chloride: 102 mEq/L (ref 96–112)
Creat: 0.87 mg/dL (ref 0.50–1.10)
Glucose, Bld: 95 mg/dL (ref 70–99)
Potassium: 4.1 mEq/L (ref 3.5–5.3)
Sodium: 139 mEq/L (ref 135–145)
Total Bilirubin: 1 mg/dL (ref 0.3–1.2)
Total Protein: 6.7 g/dL (ref 6.0–8.3)

## 2014-11-28 LAB — CBC WITH DIFFERENTIAL/PLATELET
Basophils Absolute: 0 10*3/uL (ref 0.0–0.1)
Basophils Relative: 1 % (ref 0–1)
Eosinophils Absolute: 0 10*3/uL (ref 0.0–0.7)
Eosinophils Relative: 1 % (ref 0–5)
HCT: 38.7 % (ref 36.0–46.0)
Hemoglobin: 12.8 g/dL (ref 12.0–15.0)
Lymphocytes Relative: 37 % (ref 12–46)
Lymphs Abs: 1.4 10*3/uL (ref 0.7–4.0)
MCH: 27.6 pg (ref 26.0–34.0)
MCHC: 33.1 g/dL (ref 30.0–36.0)
MCV: 83.6 fL (ref 78.0–100.0)
Monocytes Absolute: 0.4 10*3/uL (ref 0.1–1.0)
Monocytes Relative: 11 % (ref 3–12)
Neutro Abs: 1.9 10*3/uL (ref 1.7–7.7)
Neutrophils Relative %: 50 % (ref 43–77)
Platelets: 212 10*3/uL (ref 150–400)
RBC: 4.63 MIL/uL (ref 3.87–5.11)
RDW: 12.2 % (ref 11.5–15.5)
WBC: 3.7 10*3/uL — ABNORMAL LOW (ref 4.0–10.5)

## 2014-11-28 LAB — MAGNESIUM: Magnesium: 1.9 mg/dL (ref 1.5–2.5)

## 2014-11-28 LAB — LIPID PANEL
Cholesterol: 223 mg/dL — ABNORMAL HIGH (ref 0–200)
HDL: 84 mg/dL (ref >=46)
LDL Cholesterol: 126 mg/dL — ABNORMAL HIGH (ref 0–99)
Total CHOL/HDL Ratio: 2.7 Ratio
Triglycerides: 63 mg/dL (ref <150)
VLDL: 13 mg/dL (ref 0–40)

## 2014-11-28 LAB — TSH: TSH: 0.761 u[IU]/mL (ref 0.350–4.500)

## 2014-11-28 NOTE — Patient Instructions (Signed)
Your physician recommends that you continue on your current medications as directed. Please refer to the Current Medication list given to you today.    Your physician has recommended that you wear a 48 HOUR holter monitor. Holter monitors are medical devices that record the heart's electrical activity. Doctors most often use these monitors to diagnose arrhythmias. Arrhythmias are problems with the speed or rhythm of the heartbeat. The monitor is a small, portable device. You can wear one while you do your normal daily activities. This is usually used to diagnose what is causing palpitations/syncope (passing out).   Your physician has requested that you have en exercise stress myoview. For further information please visit [email or web address]. Please follow instruction sheet, as given.    Your physician recommends that you return for lab work in: TODAY ----LIPIDS, TSH, CMET, CBC W DIFF, MAGNESIUM   Your physician recommends that you schedule a follow-up appointment in: [place]
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
## 2014-12-01 ENCOUNTER — Ambulatory Visit (HOSPITAL_COMMUNITY): Payer: BC Managed Care – PPO | Attending: Internal Medicine | Admitting: Radiology

## 2014-12-01 DIAGNOSIS — R002 Palpitations: Secondary | ICD-10-CM

## 2014-12-01 DIAGNOSIS — R0602 Shortness of breath: Secondary | ICD-10-CM | POA: Diagnosis not present

## 2014-12-01 DIAGNOSIS — R0789 Other chest pain: Secondary | ICD-10-CM | POA: Diagnosis present

## 2014-12-01 MED ORDER — TECHNETIUM TC 99M SESTAMIBI GENERIC - CARDIOLITE
11.0000 | Freq: Once | INTRAVENOUS | Status: AC | PRN
  Administered 2014-12-01: 11 via INTRAVENOUS

## 2014-12-01 MED ORDER — TECHNETIUM TC 99M SESTAMIBI GENERIC - CARDIOLITE
30.0000 | Freq: Once | INTRAVENOUS | Status: AC | PRN
  Administered 2014-12-01: 30 via INTRAVENOUS

## 2014-12-01 NOTE — Progress Notes (Signed)
[facility]LE[facility] [place], [place] [address] [phone]    Cardiology Nuclear Med Study  Zamariah Seaborn Edison is a 54 y.o. female     MRN : [id number]     DOB: 1961/08/06  Procedure Date: 12/01/2014  Nuclear Med Background Indication for Stress Test:  Evaluation for Ischemia History:  No prior known history of CAD Cardiac Risk Factors: n/a  Symptoms: Chest Pain with/without exertion (last occurrence 2 days ago),DOE and Palpitations   Nuclear Pre-Procedure Caffeine/Decaff Intake:  None NPO After: 8:30pm   Lungs:  clear O2 Sat: 98% on room air. IV 0.9% NS with Angio Cath:  22g  IV Site: R Hand  IV Started by:  Matilde Haymaker, RN  Chest Size (in):  38 Cup Size: D  Height: 5\' 4"  (1.626 m)  Weight:  175 lb (79.379 kg)  BMI:  Body mass index is 30.02 kg/(m^2). Tech Comments:  n/a    Nuclear Med Study 1 or 2 day study: 1 day  Stress Test Type:  Stress  Reading MD: n/a  Order Authorizing Provider:  Filiberto Pinks  Resting Radionuclide: Technetium 46m Sestamibi  Resting Radionuclide Dose: 11.0 mCi   Stress Radionuclide:  Technetium 58m Sestamibi  Stress Radionuclide Dose: 33.0 mCi           Stress Protocol Rest HR: 57 Stress HR: 169  Rest BP: 112/73 Stress BP: 193/77  Exercise Time (min): 6:30 METS: 7.7   Predicted Max HR: 167 bpm % Max HR: 101.2 bpm Rate Pressure Product: 32617   Dose of Adenosine (mg):  n/a Dose of Lexiscan: n/a mg  Dose of Atropine (mg): n/a Dose of Dobutamine: n/a mcg/kg/min (at max HR)  Stress Test Technologist: Irven Baltimore, RN  Nuclear Technologist:  Earl Many, CNMT     Rest Procedure:  Myocardial perfusion imaging was performed at rest 45 minutes following the intravenous administration of Technetium 8m Sestamibi. Rest ECG: Sinus bradycardia, RVCD, nonspecific ST changes.  Stress Procedure:  The patient exercised on the treadmill utilizing the Bruce Protocol for 6:30 minutes, RPE=15. The patient  stopped due to DOE, Fatigue, and denied any chest pain.  Technetium 67m Sestamibi was injected at peak exercise and myocardial perfusion imaging was performed after a brief delay. Stress ECG: No significant ST segment change suggestive of ischemia.  QPS Raw Data Images:  Acquisition technically good; normal left ventricular size. Stress Images:  There is decreased uptake in the anterior wall. Rest Images:  There is decreased uptake in the anterior wall. Subtraction (SDS):  There is a fixed anteriour defect that is most consistent with breast attenuation. Transient Ischemic Dilatation (Normal <1.22):  1.06 Lung/Heart Ratio (Normal <0.45):  0.27  Quantitative Gated Spect Images QGS EDV:  67 ml QGS ESV:  20 ml  Impression Exercise Capacity:  Fair exercise capacity. BP Response:  Normal blood pressure response. Clinical Symptoms:  There is dyspnea. ECG Impression:  No significant ST segment change suggestive of ischemia. Comparison with Prior Nuclear Study: No previous nuclear study performed  Overall Impression:  Normal stress nuclear study with a small, mild, fixed anterior defect consistent with breast attenuation; no ischemia.  LV Ejection Fraction: 71%.  LV Wall Motion:  NL LV Function; NL Wall Motion  [name]

## 2014-12-11 ENCOUNTER — Ambulatory Visit: Payer: BC Managed Care – PPO | Admitting: Interventional Cardiology

## 2014-12-23 ENCOUNTER — Encounter: Payer: Self-pay | Admitting: *Deleted

## 2014-12-23 ENCOUNTER — Encounter (INDEPENDENT_AMBULATORY_CARE_PROVIDER_SITE_OTHER): Payer: BC Managed Care – PPO

## 2014-12-23 DIAGNOSIS — R002 Palpitations: Secondary | ICD-10-CM

## 2014-12-23 NOTE — Progress Notes (Signed)
Patient ID: Yvette Stanton, female   DOB: Jan 28, 1961, 54 y.o.   MRN: [id number] [facility] 48 hour monitor applied

## 2015-01-08 ENCOUNTER — Telehealth: Payer: Self-pay | Admitting: Interventional Cardiology

## 2015-01-08 ENCOUNTER — Telehealth: Payer: Self-pay

## 2015-01-08 NOTE — Telephone Encounter (Signed)
New Message  Pt returned call for results

## 2015-01-08 NOTE — Telephone Encounter (Signed)
Called to give pt holter results.lmtcb 

## 2015-01-09 ENCOUNTER — Ambulatory Visit: Payer: BC Managed Care – PPO | Admitting: Cardiology

## 2015-01-09 NOTE — Telephone Encounter (Signed)
Pt aware of holter monitor results -Normal -No therapy needed Pt verbalized understanding.

## 2015-02-12 ENCOUNTER — Other Ambulatory Visit (HOSPITAL_COMMUNITY): Payer: Self-pay | Admitting: Family Medicine

## 2015-02-12 DIAGNOSIS — Z1231 Encounter for screening mammogram for malignant neoplasm of breast: Secondary | ICD-10-CM

## 2015-02-20 ENCOUNTER — Ambulatory Visit (INDEPENDENT_AMBULATORY_CARE_PROVIDER_SITE_OTHER): Payer: BC Managed Care – PPO | Admitting: Cardiology

## 2015-02-20 ENCOUNTER — Encounter: Payer: Self-pay | Admitting: *Deleted

## 2015-02-20 ENCOUNTER — Other Ambulatory Visit: Payer: Self-pay | Admitting: *Deleted

## 2015-02-20 VITALS — BP 116/68 | HR 73 | Ht 64.0 in | Wt 176.0 lb

## 2015-02-20 DIAGNOSIS — R072 Precordial pain: Secondary | ICD-10-CM

## 2015-02-20 DIAGNOSIS — R002 Palpitations: Secondary | ICD-10-CM | POA: Diagnosis not present

## 2015-02-20 DIAGNOSIS — E785 Hyperlipidemia, unspecified: Secondary | ICD-10-CM

## 2015-02-20 NOTE — Patient Instructions (Signed)
Medication Instructions:  None  Labwork: None  Testing/Procedures: None  Follow-Up: Your physician wants you to follow-up in: 1 year with Dr. Meda Coffee.  You will receive a reminder letter in the mail two months in advance. If you don't receive a letter, please call our office to schedule the follow-up appointment.   Any Other Special Instructions Will Be Listed Below (If Applicable).
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
## 2015-02-26 ENCOUNTER — Other Ambulatory Visit (HOSPITAL_COMMUNITY): Payer: Self-pay | Admitting: Family Medicine

## 2015-02-26 ENCOUNTER — Ambulatory Visit (HOSPITAL_COMMUNITY)
Admission: RE | Admit: 2015-02-26 | Discharge: 2015-02-26 | Disposition: A | Discharge status: Home / Self Care | Payer: BC Managed Care – PPO | Source: Ambulatory Visit | Attending: Family Medicine | Admitting: Family Medicine

## 2015-02-26 DIAGNOSIS — Z1231 Encounter for screening mammogram for malignant neoplasm of breast: Secondary | ICD-10-CM | POA: Diagnosis present

## 2015-03-31 ENCOUNTER — Telehealth: Payer: Self-pay | Admitting: Cardiology

## 2015-03-31 NOTE — Telephone Encounter (Signed)
Left message to call back  

## 2015-03-31 NOTE — Telephone Encounter (Signed)
New message    Pt is wanting to know if doctor advises her to take zq10. Please call to discuss

## 2015-03-31 NOTE — Telephone Encounter (Signed)
Follow up    Pt returning call regarding cq10 Please call to discuss

## 2015-03-31 NOTE — Telephone Encounter (Signed)
Patient called and asked if Dr. Meda Coffee recommends that she take cq 10.    Discussed with Dr at [facility] but could not recall is she recommended it or not.

## 2015-04-01 NOTE — Telephone Encounter (Signed)
Called patient. LVM call back re: Co Q 10 message from Dr. Meda Coffee

## 2015-04-01 NOTE — Telephone Encounter (Signed)
Follow up    Pt returning Mary's call

## 2015-04-01 NOTE — Telephone Encounter (Signed)
Patient given Dr. Thera Flake message.  Patient said she won't take it then

## 2015-04-01 NOTE — Telephone Encounter (Signed)
There is currently no strong evidence in the literature that would support benefit of coenzyme Q 10.  What that means that it is not going to harm her but at the same time that is no warranty it will help.

## 2015-05-01 ENCOUNTER — Telehealth: Payer: Self-pay | Admitting: Cardiology

## 2015-05-01 NOTE — Telephone Encounter (Signed)
Pt calling to ask about what Dr Meda Coffee discussed with her at her last OV with the pt.  Pt states that Dr Meda Coffee talked about exercise and diet, but unsure how frequent she wanted her to exercise.  Went over thoroughly with the pt over Dr Francesca Oman assessment and plan from last OV on 02/20/15.  Informed the pt that per Dr Meda Coffee she discussed the pts exercise nuclear stress test showing no scar or ischemia but poor exercise capacity, and they had a discussion about necessity of 30 minutes of cardio exercise 5x/week, which the pt was very motivated about. Also informed the pt that per Dr Meda Coffee they discussed at that [place] her hyperlipidemia - LDL 126, low TG, high HDL, and she recommended the pt to just exercise for now, diet,  and add OTC red yeast rice. Follow up in 1 year. Pt verbalized understanding and gracious for all the assistance provided.

## 2015-05-01 NOTE — Telephone Encounter (Signed)
New message     Pt needs you to go over the notes with her from her last office visit Please call to discuss

## 2015-08-26 ENCOUNTER — Other Ambulatory Visit (HOSPITAL_COMMUNITY)
Admission: RE | Admit: 2015-08-26 | Discharge: 2015-08-26 | Disposition: A | Discharge status: Home / Self Care | Payer: BC Managed Care – PPO | Source: Ambulatory Visit | Attending: Family Medicine | Admitting: Family Medicine

## 2015-08-26 ENCOUNTER — Other Ambulatory Visit: Payer: Self-pay | Admitting: Family Medicine

## 2015-08-26 DIAGNOSIS — Z01411 Encounter for gynecological examination (general) (routine) with abnormal findings: Secondary | ICD-10-CM | POA: Diagnosis present

## 2015-08-27 LAB — CYTOLOGY - PAP

## 2015-09-14 ENCOUNTER — Telehealth: Payer: Self-pay | Admitting: Cardiology

## 2015-09-14 NOTE — Telephone Encounter (Signed)
New Message    Pt is calling for rn to read her the notes from the last office visit

## 2015-09-14 NOTE — Telephone Encounter (Signed)
Went over the pts last OV note with Dr Meda Coffee on 02/20/15.  Pt verbalized understanding and agreed with this plan.

## 2015-09-23 ENCOUNTER — Telehealth: Payer: Self-pay | Admitting: Cardiology

## 2015-09-23 NOTE — Telephone Encounter (Signed)
Notified the pt back to inform her that I spoke with our Pharmacist Megan, and she reviewed her current meds and recent Mg level, and she states that being the pt is only taking Mag-citrate 400 mg po daily, this is safe, however she should contact her Neurologist who ordered this and request for a Magnesium level to be drawn in one month for follow-up of levels.  Pt verbalized understanding and agrees with this plan.  Pt states she will call her Neurologist and schedule a lab appt to check a Magnesium level in one month.

## 2015-09-23 NOTE — Telephone Encounter (Signed)
Pt calling to inform Dr Meda Coffee that she went to see a Neurologist and they prescribed for her to take Magnesium Citrate 400 mg po daily for consistent headaches, and she read somewhere that this could be potentially dangerous for her heart if Mg levels increase.  Pt would like for Dr Meda Coffee to advise if this medication is contraindicated from a cardiac standpoint.  Informed the pt that we checked her magnesium in 03/16 and it was normal at 1.9.  Informed the pt that Dr Meda Coffee is out of the office this week, but I will go and speak with our PharmD for further review and recommendation of her concerns.  Pt verbalized understanding and agrees with this plan.

## 2015-09-23 NOTE — Telephone Encounter (Signed)
New message      Pt has a question about magnesium citrate supplement.  Please call

## 2015-10-05 ENCOUNTER — Telehealth: Payer: Self-pay | Admitting: Cardiology

## 2015-10-05 NOTE — Telephone Encounter (Signed)
She wants to know if she can take 1 teaspoon of cinnamon for heart?

## 2015-10-06 NOTE — Telephone Encounter (Signed)
Follow up ° ° ° ° ° °Returning a call to the nurse °

## 2015-10-06 NOTE — Telephone Encounter (Signed)
Spoke with the pt to inform her that I spoke with our PharmD Elberta Leatherwood, and per Gay Filler, there is no proven science and facts behind cardiac pts taking a teaspoon of cinnamon for their heart.  Pt verbalized understanding and gracious for all the assistance provided.

## 2015-10-06 NOTE — Telephone Encounter (Signed)
Left a message for the pt to call back.  

## 2015-11-16 DIAGNOSIS — M2240 Chondromalacia patellae, unspecified knee: Secondary | ICD-10-CM | POA: Insufficient documentation

## 2015-12-26 ENCOUNTER — Encounter (HOSPITAL_COMMUNITY): Payer: Self-pay

## 2015-12-26 ENCOUNTER — Ambulatory Visit (HOSPITAL_COMMUNITY)
Admission: EM | Admit: 2015-12-26 | Discharge: 2015-12-26 | Disposition: A | Discharge status: Home / Self Care | Payer: BC Managed Care – PPO | Attending: Emergency Medicine | Admitting: Emergency Medicine

## 2015-12-26 DIAGNOSIS — B349 Viral infection, unspecified: Secondary | ICD-10-CM | POA: Diagnosis not present

## 2015-12-26 NOTE — Discharge Instructions (Signed)
You have a viral illness. Continue the cold and flu medicine you are taking. Make sure you are getting plenty of rest and drinking plenty of fluids. Symptoms typically peak around day 4 and gradually improve over the next several days. You can return to work when you have been without fever for 24 hours. Follow-up as needed.

## 2015-12-26 NOTE — ED Notes (Signed)
Patient presents with flu-like symptoms x3 days, body aches, chills, low-grade fever and sore throat. Patient has taken liquid Tylenol and Flu for symptoms No acute distress
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
## 2016-02-22 ENCOUNTER — Other Ambulatory Visit: Payer: Self-pay | Admitting: Family Medicine

## 2016-02-22 DIAGNOSIS — Z1231 Encounter for screening mammogram for malignant neoplasm of breast: Secondary | ICD-10-CM

## 2016-02-23 ENCOUNTER — Ambulatory Visit (INDEPENDENT_AMBULATORY_CARE_PROVIDER_SITE_OTHER): Payer: BC Managed Care – PPO | Admitting: Cardiology

## 2016-02-23 ENCOUNTER — Encounter: Payer: Self-pay | Admitting: Cardiology

## 2016-02-23 VITALS — BP 122/68 | HR 70 | Ht 64.0 in | Wt 164.0 lb

## 2016-02-23 DIAGNOSIS — R002 Palpitations: Secondary | ICD-10-CM | POA: Diagnosis not present

## 2016-02-23 DIAGNOSIS — R072 Precordial pain: Secondary | ICD-10-CM

## 2016-02-23 DIAGNOSIS — E785 Hyperlipidemia, unspecified: Secondary | ICD-10-CM | POA: Diagnosis not present
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
## 2016-02-23 NOTE — Patient Instructions (Signed)
Your physician recommends that you continue on your current medications as directed. Please refer to the Current Medication list given to you today.   Your physician wants you to follow-up in: ONE YEAR WITH DR NELSON You will receive a reminder letter in the mail two months in advance. If you don't receive a letter, please call our office to schedule the follow-up appointment.  

## 2016-02-25 DIAGNOSIS — G4485 Primary stabbing headache: Secondary | ICD-10-CM | POA: Insufficient documentation

## 2016-02-29 ENCOUNTER — Ambulatory Visit: Payer: BC Managed Care – PPO

## 2016-03-01 ENCOUNTER — Ambulatory Visit
Admission: RE | Admit: 2016-03-01 | Discharge: 2016-03-01 | Disposition: A | Discharge status: Home / Self Care | Payer: BC Managed Care – PPO | Source: Ambulatory Visit | Attending: Family Medicine | Admitting: Family Medicine

## 2016-03-01 DIAGNOSIS — Z1231 Encounter for screening mammogram for malignant neoplasm of breast: Secondary | ICD-10-CM

## 2016-05-17 ENCOUNTER — Other Ambulatory Visit (HOSPITAL_COMMUNITY): Payer: Self-pay | Admitting: Endocrinology

## 2016-05-17 DIAGNOSIS — E0590 Thyrotoxicosis, unspecified without thyrotoxic crisis or storm: Secondary | ICD-10-CM

## 2016-05-26 ENCOUNTER — Encounter (HOSPITAL_COMMUNITY): Payer: BC Managed Care – PPO

## 2016-05-27 ENCOUNTER — Encounter (HOSPITAL_COMMUNITY): Payer: BC Managed Care – PPO

## 2016-06-13 ENCOUNTER — Encounter (HOSPITAL_COMMUNITY): Payer: BC Managed Care – PPO | Attending: Endocrinology

## 2016-06-13 DIAGNOSIS — D259 Leiomyoma of uterus, unspecified: Secondary | ICD-10-CM | POA: Insufficient documentation

## 2016-06-13 DIAGNOSIS — J309 Allergic rhinitis, unspecified: Secondary | ICD-10-CM | POA: Insufficient documentation

## 2016-06-13 DIAGNOSIS — G4451 Hemicrania continua: Secondary | ICD-10-CM | POA: Insufficient documentation

## 2016-06-13 DIAGNOSIS — Z8639 Personal history of other endocrine, nutritional and metabolic disease: Secondary | ICD-10-CM | POA: Insufficient documentation

## 2016-06-13 DIAGNOSIS — E559 Vitamin D deficiency, unspecified: Secondary | ICD-10-CM | POA: Insufficient documentation

## 2016-06-13 DIAGNOSIS — D219 Benign neoplasm of connective and other soft tissue, unspecified: Secondary | ICD-10-CM | POA: Insufficient documentation

## 2016-06-13 DIAGNOSIS — I493 Ventricular premature depolarization: Secondary | ICD-10-CM | POA: Insufficient documentation

## 2016-06-13 DIAGNOSIS — D509 Iron deficiency anemia, unspecified: Secondary | ICD-10-CM | POA: Insufficient documentation

## 2016-06-14 ENCOUNTER — Encounter (HOSPITAL_COMMUNITY): Payer: BC Managed Care – PPO

## 2016-06-14 DIAGNOSIS — E0590 Thyrotoxicosis, unspecified without thyrotoxic crisis or storm: Secondary | ICD-10-CM | POA: Insufficient documentation

## 2016-07-04 ENCOUNTER — Ambulatory Visit (HOSPITAL_COMMUNITY)
Admission: RE | Admit: 2016-07-04 | Discharge: 2016-07-04 | Disposition: A | Discharge status: Home / Self Care | Payer: BC Managed Care – PPO | Source: Ambulatory Visit | Attending: Endocrinology | Admitting: Endocrinology

## 2016-07-04 DIAGNOSIS — E0590 Thyrotoxicosis, unspecified without thyrotoxic crisis or storm: Secondary | ICD-10-CM | POA: Insufficient documentation

## 2016-07-04 MED ORDER — SODIUM IODIDE I 131 CAPSULE
7.1000 | Freq: Once | INTRAVENOUS | Status: AC | PRN
  Administered 2016-07-04: 7.1 via ORAL

## 2016-07-05 ENCOUNTER — Encounter (HOSPITAL_COMMUNITY)
Admission: RE | Admit: 2016-07-05 | Discharge: 2016-07-05 | Disposition: A | Discharge status: Home / Self Care | Payer: BC Managed Care – PPO | Source: Ambulatory Visit | Attending: Endocrinology | Admitting: Endocrinology

## 2016-07-05 DIAGNOSIS — E0590 Thyrotoxicosis, unspecified without thyrotoxic crisis or storm: Secondary | ICD-10-CM | POA: Diagnosis present

## 2016-07-05 MED ORDER — SODIUM PERTECHNETATE TC 99M INJECTION
10.0000 | Freq: Once | INTRAVENOUS | Status: AC | PRN
  Administered 2016-07-05: 10 via INTRAVENOUS

## 2017-01-13 ENCOUNTER — Telehealth: Payer: Self-pay | Admitting: Cardiology

## 2017-01-13 NOTE — Telephone Encounter (Signed)
New message      Pt wants to see another doctor does no want to wait til September for an appt,  Does not want to see APP

## 2017-01-13 NOTE — Telephone Encounter (Signed)
Scheduled the pt to see Dr Meda Coffee on her DOD Day, as advised by Dr Meda Coffee, for 03/01/17 at 1040.  Pt aware to arrive 15 mins prior too this appt.  This is for overdue follow-up for the pt, for she was due for follow-up in one year in May. Pt gracious for all the assistance provided.

## 2017-01-25 ENCOUNTER — Other Ambulatory Visit: Payer: Self-pay | Admitting: Family Medicine

## 2017-01-25 DIAGNOSIS — Z1231 Encounter for screening mammogram for malignant neoplasm of breast: Secondary | ICD-10-CM

## 2017-03-01 ENCOUNTER — Ambulatory Visit (INDEPENDENT_AMBULATORY_CARE_PROVIDER_SITE_OTHER): Payer: BC Managed Care – PPO | Admitting: Cardiology

## 2017-03-01 ENCOUNTER — Encounter: Payer: Self-pay | Admitting: Cardiology

## 2017-03-01 ENCOUNTER — Encounter (INDEPENDENT_AMBULATORY_CARE_PROVIDER_SITE_OTHER): Payer: Self-pay

## 2017-03-01 VITALS — BP 116/64 | HR 74 | Ht 64.0 in | Wt 164.0 lb

## 2017-03-01 DIAGNOSIS — R002 Palpitations: Secondary | ICD-10-CM | POA: Diagnosis not present

## 2017-03-01 DIAGNOSIS — R072 Precordial pain: Secondary | ICD-10-CM | POA: Diagnosis not present

## 2017-03-01 DIAGNOSIS — E785 Hyperlipidemia, unspecified: Secondary | ICD-10-CM | POA: Diagnosis not present

## 2017-03-01 LAB — CBC WITH DIFFERENTIAL/PLATELET
Basophils Absolute: 0 10*3/uL (ref 0.0–0.2)
Basos: 1 %
EOS (ABSOLUTE): 0 10*3/uL (ref 0.0–0.4)
Eos: 1 %
Hematocrit: 37.4 % (ref 34.0–46.6)
Hemoglobin: 12.4 g/dL (ref 11.1–15.9)
Immature Grans (Abs): 0 10*3/uL (ref 0.0–0.1)
Immature Granulocytes: 0 %
Lymphocytes Absolute: 1.7 10*3/uL (ref 0.7–3.1)
Lymphs: 42 %
MCH: 27.9 pg (ref 26.6–33.0)
MCHC: 33.2 g/dL (ref 31.5–35.7)
MCV: 84 fL (ref 79–97)
Monocytes Absolute: 0.3 10*3/uL (ref 0.1–0.9)
Monocytes: 7 %
Neutrophils Absolute: 1.9 10*3/uL (ref 1.4–7.0)
Neutrophils: 49 %
Platelets: 242 10*3/uL (ref 150–379)
RBC: 4.45 x10E6/uL (ref 3.77–5.28)
RDW: 12.9 % (ref 12.3–15.4)
WBC: 4 10*3/uL (ref 3.4–10.8)

## 2017-03-01 LAB — COMPREHENSIVE METABOLIC PANEL
ALT: 11 IU/L (ref 0–32)
AST: 19 IU/L (ref 0–40)
Albumin/Globulin Ratio: 1.9 (ref 1.2–2.2)
Albumin: 4.8 g/dL (ref 3.5–5.5)
Alkaline Phosphatase: 99 IU/L (ref 39–117)
BUN/Creatinine Ratio: 12 (ref 9–23)
BUN: 10 mg/dL (ref 6–24)
Bilirubin Total: 0.7 mg/dL (ref 0.0–1.2)
CO2: 27 mmol/L (ref 20–29)
Calcium: 9.8 mg/dL (ref 8.7–10.2)
Chloride: 101 mmol/L (ref 96–106)
Creatinine, Ser: 0.81 mg/dL (ref 0.57–1.00)
GFR calc Af Amer: 94 mL/min/{1.73_m2} (ref >59)
GFR calc non Af Amer: 81 mL/min/{1.73_m2} (ref >59)
Globulin, Total: 2.5 g/dL (ref 1.5–4.5)
Glucose: 98 mg/dL (ref 65–99)
Potassium: 4.6 mmol/L (ref 3.5–5.2)
Sodium: 141 mmol/L (ref 134–144)
Total Protein: 7.3 g/dL (ref 6.0–8.5)

## 2017-03-01 LAB — LIPID PANEL
Chol/HDL Ratio: 2.2 ratio (ref 0.0–4.4)
Cholesterol, Total: 219 mg/dL — ABNORMAL HIGH (ref 100–199)
HDL: 101 mg/dL (ref >39)
LDL Calculated: 104 mg/dL — ABNORMAL HIGH (ref 0–99)
Triglycerides: 71 mg/dL (ref 0–149)
VLDL Cholesterol Cal: 14 mg/dL (ref 5–40)

## 2017-03-01 LAB — MAGNESIUM: Magnesium: 2.1 mg/dL (ref 1.6–2.3)

## 2017-03-01 LAB — TSH: TSH: 1.37 u[IU]/mL (ref 0.450–4.500)
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
## 2017-03-01 NOTE — Patient Instructions (Signed)
Medication Instructions:   Your physician recommends that you continue on your current medications as directed. Please refer to the Current Medication list given to you today.    Labwork:  TODAY--CMET, CBC W DIFF, TSH, LIPIDS, AND MAGNESIUM     Follow-Up:  Your physician wants you to follow-up in: [place] will receive a reminder letter in the mail two months in advance. If you don't receive a letter, please call our office to schedule the follow-up appointment.        If you need a refill on your cardiac medications before your next appointment, please call your pharmacy.

## 2017-03-02 ENCOUNTER — Ambulatory Visit
Admission: RE | Admit: 2017-03-02 | Discharge: 2017-03-02 | Disposition: A | Discharge status: Home / Self Care | Payer: BC Managed Care – PPO | Source: Ambulatory Visit | Attending: Family Medicine | Admitting: Family Medicine

## 2017-03-02 DIAGNOSIS — Z1231 Encounter for screening mammogram for malignant neoplasm of breast: Secondary | ICD-10-CM

## 2017-08-17 ENCOUNTER — Telehealth: Payer: Self-pay | Admitting: *Deleted

## 2017-08-17 MED ORDER — ASPIRIN EC 81 MG PO TBEC: 81.0000 mg | DELAYED_RELEASE_TABLET | Freq: Every day | ORAL | 3 refills | Status: DC

## 2017-08-17 NOTE — Telephone Encounter (Signed)
-----   Message from [place] sent at 08/16/2017  1:13 PM EST ----- Contact: [phone] Darcella Cheshire,   Patient called has questions about medications. Please call back    Thanks, Maudie Mercury

## 2017-08-17 NOTE — Addendum Note (Signed)
Addended by: Nuala Alpha on: 08/17/2017 03:40 PM   Modules accepted: Orders

## 2017-08-17 NOTE — Telephone Encounter (Signed)
Pt calling to ask Dr Meda Coffee 2 questions:  1.)  Should she be taking ASA 81 mg tablet daily?  2.)  Does Dr Meda Coffee want her to continue taking Magnesium Glycinate?  Informed the pt that Dr Meda Coffee is out of the office today, but I will route this message to her for further review and recommendation of questions asked, and follow-up with the pt shortly thereafter.  Pt verbalized understanding and agrees with this.

## 2017-08-17 NOTE — Telephone Encounter (Signed)
I would recommend to take aspirin 81 mg po daily, she doesn't need to continue taking magnesium

## 2017-08-17 NOTE — Telephone Encounter (Signed)
Spoke with the pt and informed her that per Dr Meda Coffee, she can start taking ASA 81 mg po daily, and she should stop taking Magnesium.  D/C'ed Magnesium out of the pts med list.  Advised the pt to obtain ASA OTC.  Pt verbalized understanding and agrees with this plan.

## 2017-09-27 ENCOUNTER — Telehealth: Payer: Self-pay | Admitting: Cardiology

## 2017-09-27 NOTE — Telephone Encounter (Signed)
New message   Patient didn't want to go into detail but says she has a question regarding vitamin D and her heart Please call

## 2017-09-27 NOTE — Telephone Encounter (Signed)
Pt just calling to inform Dr Meda Coffee that she has decreased Vit D level that her PCP found, and she is currently taking OTC Vit D supplements to help increase this.  Pt states that its not severely low, just mildly low.  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  Pt states that on very rare occassions, she feels slight fluttering in her chest, but notes this mostly occurs when her stress level is high, and she has consumed a large amount of caffeine. 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  Pt does not complain of chest pain, tightness, sob, doe, dizziness, pre-syncopal or syncopal episodes, during her rare occassions of "fluttering." Pt does not want to go on any meds at this time, she simply just wanted to run this information by Dr Meda Coffee.  Advised the pt to limit her caffeine intake, hydrate well, start back her daily exercise regimen for stress reduction, and see if that helps with her occasional "fluttering" in her chest.  Advised the pt that if that doesn't help, and her palpitations/fluttering becomes a daily thing, then she should notify our office of this for further recommendations.  Informed the pt that Dr Meda Coffee is out of the office this week, but I will route this message to her for further review, and recommendation if needed.  Pt verbalized understanding and agrees with this plan.

## 2017-09-30 NOTE — Telephone Encounter (Signed)
I agree with all of your recommendation! 48 hour Holter if her palpitations are more symptomatic and frequent, OTC vitamin D is good, I would have levels repeat in 3 months. KN

## 2017-10-02 NOTE — Telephone Encounter (Signed)
Spoke with the pt and informed her that Dr Meda Coffee agreed with all recommendations provided to the pt last week. Advised the pt that per Dr Meda Coffee, if her palpitations are more symptomatic and frequent, then she should call our office, and we will order for her to have a 48 hour holter monitor placed.  Informed the pt that Dr Meda Coffee recommends that she take OTC Vit D supplements, as advised by her PCP, and have them repeat her levels in 3 months.  Pt states that she is taking Vit D now and they will be following her levels.  Pt verbalized understanding and agrees with this plan.

## 2017-10-03 IMAGING — NM NM THYROID IMAGING W/ UPTAKE SINGLE (24 HR)
4 series · 4 of 4 positions shown · non-contrast
Comparison: None

CLINICAL DATA: Hyperthyroidism, weight loss; TSH =

EXAM:
THYROID SCAN AND UPTAKE - 24 HOURS
TECHNIQUE: Following the per oral administration of B-YBY sodium iodide, the
patient returned at 24 hours and uptake measurements were acquired
with the uptake probe centered on the neck. Thyroid imaging was
performed following the intravenous administration of the Ac-ZZm
Pertechnetate.
RADIOPHARMACEUTICALS:  7.1 MicroCuries B-YBY sodium iodide orally
and 10 mCi Kechnetium-33m pertechnetate IV

[Series 2: anterior · 3.25mm/px · 1 of 1 slices shown]
[im 1/1]
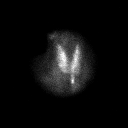

[Series 3: ant w marker · 3.25mm/px · 1 of 1 slices shown]
[im 1/1]
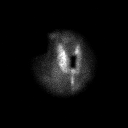

[Series 4: lao · 3.25mm/px · 1 of 1 slices shown]
[im 1/1]
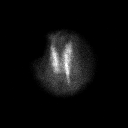

[Series 5: rao · 3.25mm/px · 1 of 1 slices shown]
[im 1/1]
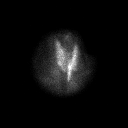

[4 of 4 positions shown; findings below may reference images not displayed]

FINDINGS: 24 hour radio iodine uptake calculated at 10%, at the lower end of
the normal range.

Imaging of the thyroid gland in 3 projections demonstrates
homogeneous tracer distribution throughout both thyroid lobes.

A focal area of increased pertechnetate localization is seen
inferior to the inferior aspect of the LEFT thyroid lobe,
indeterminate; this could represent linear inferior extension of
LEFT lobe thyroid tissue or could represent swallowed salivary
tracer within esophagus.
IMPRESSION: Low normal 24 hour radio iodine uptake of 10%.

No focal areas of increased or decreased tracer localization seen
within either thyroid lobe.

Linear increased tracer localization inferior to the LEFT lobe,
question inferior extension of LEFT thyroid tissue versus
superimposed swallowed salivary tracer within the esophagus.

## 2018-03-01 ENCOUNTER — Encounter: Payer: Self-pay | Admitting: Cardiology

## 2018-03-06 ENCOUNTER — Other Ambulatory Visit: Payer: Self-pay | Admitting: Family Medicine

## 2018-03-06 DIAGNOSIS — Z1231 Encounter for screening mammogram for malignant neoplasm of breast: Secondary | ICD-10-CM

## 2018-03-07 ENCOUNTER — Ambulatory Visit: Payer: BC Managed Care – PPO

## 2018-03-09 ENCOUNTER — Ambulatory Visit: Payer: BC Managed Care – PPO | Admitting: Cardiology

## 2018-03-15 ENCOUNTER — Ambulatory Visit (INDEPENDENT_AMBULATORY_CARE_PROVIDER_SITE_OTHER): Payer: BC Managed Care – PPO | Admitting: Cardiology

## 2018-03-15 ENCOUNTER — Encounter: Payer: Self-pay | Admitting: Cardiology

## 2018-03-15 VITALS — BP 112/76 | HR 71 | Ht 64.0 in | Wt 176.4 lb

## 2018-03-15 DIAGNOSIS — I493 Ventricular premature depolarization: Secondary | ICD-10-CM

## 2018-03-15 DIAGNOSIS — Z8249 Family history of ischemic heart disease and other diseases of the circulatory system: Secondary | ICD-10-CM | POA: Diagnosis not present
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
## 2018-03-15 NOTE — Patient Instructions (Signed)
Medication Instructions:  Your physician recommends that you continue on your current medications as directed. Please refer to the Current Medication list given to you today.  Labwork: None  Testing/Procedures: Your provider recommends that you have a Calcium Score performed.   Follow-Up: Your physician wants you to follow-up in: 1 year with Dr. Meda Coffee.  You will receive a reminder letter in the mail two months in advance. If you don't receive a letter, please call our office to schedule the follow-up appointment.   Any Other Special Instructions Will Be Listed Below (If Applicable).     If you need a refill on your cardiac medications before your next appointment, please call your pharmacy.

## 2018-03-29 ENCOUNTER — Inpatient Hospital Stay: Admission: RE | Admit: 2018-03-29 | Payer: BC Managed Care – PPO | Source: Ambulatory Visit

## 2018-03-30 ENCOUNTER — Ambulatory Visit: Payer: BC Managed Care – PPO

## 2018-04-05 ENCOUNTER — Encounter

## 2018-04-05 ENCOUNTER — Ambulatory Visit: Admission: RE | Admit: 2018-04-05 | Payer: BC Managed Care – PPO | Source: Ambulatory Visit

## 2018-04-05 ENCOUNTER — Ambulatory Visit
Admission: RE | Admit: 2018-04-05 | Discharge: 2018-04-05 | Disposition: A | Discharge status: Home / Self Care | Payer: BC Managed Care – PPO | Source: Ambulatory Visit | Attending: Family Medicine | Admitting: Family Medicine

## 2018-04-05 ENCOUNTER — Ambulatory Visit: Payer: BC Managed Care – PPO

## 2018-04-05 DIAGNOSIS — Z1231 Encounter for screening mammogram for malignant neoplasm of breast: Secondary | ICD-10-CM

## 2018-04-11 ENCOUNTER — Ambulatory Visit: Admission: RE | Admit: 2018-04-11 | Payer: BC Managed Care – PPO | Source: Ambulatory Visit

## 2018-05-08 ENCOUNTER — Inpatient Hospital Stay: Admission: RE | Admit: 2018-05-08 | Payer: BC Managed Care – PPO | Source: Ambulatory Visit

## 2018-08-07 ENCOUNTER — Encounter (INDEPENDENT_AMBULATORY_CARE_PROVIDER_SITE_OTHER): Payer: Self-pay | Admitting: Orthopaedic Surgery

## 2018-08-07 ENCOUNTER — Ambulatory Visit (INDEPENDENT_AMBULATORY_CARE_PROVIDER_SITE_OTHER): Payer: BC Managed Care – PPO | Admitting: Orthopaedic Surgery

## 2018-08-07 ENCOUNTER — Ambulatory Visit (INDEPENDENT_AMBULATORY_CARE_PROVIDER_SITE_OTHER): Payer: BC Managed Care – PPO

## 2018-08-07 DIAGNOSIS — Z89422 Acquired absence of other left toe(s): Secondary | ICD-10-CM | POA: Diagnosis not present

## 2018-08-07 DIAGNOSIS — S92515A Nondisplaced fracture of proximal phalanx of left lesser toe(s), initial encounter for closed fracture: Secondary | ICD-10-CM
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
## 2018-08-08 ENCOUNTER — Telehealth (INDEPENDENT_AMBULATORY_CARE_PROVIDER_SITE_OTHER): Payer: Self-pay | Admitting: Orthopaedic Surgery

## 2018-08-08 NOTE — Telephone Encounter (Signed)
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 Erlinda Hong had put her in a boot for her broken toe, she wanted to know if it is okay to do a lot of walking.  CB#[phone].  Thank you.

## 2018-08-08 NOTE — Telephone Encounter (Signed)
It's ok to walk as long as she's wearing the postop shoe

## 2018-08-08 NOTE — Telephone Encounter (Signed)
We gave her post op shoe yesterday. See message below.

## 2018-08-09 NOTE — Telephone Encounter (Signed)
Called patient

## 2018-08-14 ENCOUNTER — Inpatient Hospital Stay: Admission: RE | Admit: 2018-08-14 | Payer: BC Managed Care – PPO | Source: Ambulatory Visit

## 2018-08-24 ENCOUNTER — Ambulatory Visit (INDEPENDENT_AMBULATORY_CARE_PROVIDER_SITE_OTHER): Payer: BC Managed Care – PPO

## 2018-08-24 ENCOUNTER — Telehealth (INDEPENDENT_AMBULATORY_CARE_PROVIDER_SITE_OTHER): Payer: Self-pay | Admitting: Orthopaedic Surgery

## 2018-08-24 NOTE — Telephone Encounter (Signed)
Patient called asked if she can come by today and get another shoe. Patient said the shoe is not working. Patient said her foot is not getting any better. The number to contact patient is [phone]

## 2018-08-24 NOTE — Telephone Encounter (Signed)
Patient will be coming in today for another post op shoe.

## 2018-09-03 ENCOUNTER — Telehealth (INDEPENDENT_AMBULATORY_CARE_PROVIDER_SITE_OTHER): Payer: Self-pay

## 2018-09-03 NOTE — Telephone Encounter (Signed)
Yes as long as she buddy tapes the toes

## 2018-09-03 NOTE — Telephone Encounter (Signed)
Patient would like to know if its okay to go swimming?  S/P minimally displaced oblique fracture of the proximal phalanx of her left small toe   [phone]

## 2018-09-04 ENCOUNTER — Telehealth: Payer: Self-pay

## 2018-09-04 ENCOUNTER — Ambulatory Visit (INDEPENDENT_AMBULATORY_CARE_PROVIDER_SITE_OTHER)
Admission: RE | Admit: 2018-09-04 | Discharge: 2018-09-04 | Disposition: A | Discharge status: Home / Self Care | Payer: Self-pay | Source: Ambulatory Visit | Attending: Cardiology | Admitting: Cardiology

## 2018-09-04 ENCOUNTER — Ambulatory Visit (INDEPENDENT_AMBULATORY_CARE_PROVIDER_SITE_OTHER): Payer: BC Managed Care – PPO | Admitting: Orthopaedic Surgery

## 2018-09-04 ENCOUNTER — Ambulatory Visit (INDEPENDENT_AMBULATORY_CARE_PROVIDER_SITE_OTHER): Payer: Self-pay

## 2018-09-04 ENCOUNTER — Encounter (INDEPENDENT_AMBULATORY_CARE_PROVIDER_SITE_OTHER): Payer: Self-pay | Admitting: Orthopaedic Surgery

## 2018-09-04 VITALS — Ht 64.0 in | Wt 176.0 lb

## 2018-09-04 DIAGNOSIS — S92515A Nondisplaced fracture of proximal phalanx of left lesser toe(s), initial encounter for closed fracture: Secondary | ICD-10-CM

## 2018-09-04 DIAGNOSIS — Z8249 Family history of ischemic heart disease and other diseases of the circulatory system: Secondary | ICD-10-CM

## 2018-09-04 NOTE — Telephone Encounter (Signed)
The patient was inquiring about drinking tea.  I mentioned to her that due to her hx of palpitations and irregular heart rhythm, she should stay away from any caffienated beverages, this may induce palpitations.  She verbalized understanding and was thankful for the call.
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
## 2018-09-04 NOTE — Telephone Encounter (Signed)
Patient will be advised of message below at office visit on 09/04/2018 with Dr. Erlinda Hong.

## 2018-09-06 ENCOUNTER — Telehealth: Payer: Self-pay

## 2018-09-06 NOTE — Telephone Encounter (Signed)
-----   Message from Isaiah Serge, NP sent at 09/04/2018  6:20 PM EST ----- Good news, ca score 0 low risk for CAD and lungs were normal.  Will leave for [place] if any further recommendations.

## 2018-09-06 NOTE — Telephone Encounter (Signed)
Notes recorded by Frederik Schmidt, RN on 09/06/2018 at 11:08 AM EST The patient has been notified of the result and verbalized understanding. All questions (if any) were answered. Frederik Schmidt, RN 09/06/2018 11:08 AM

## 2018-09-12 ENCOUNTER — Telehealth: Payer: Self-pay

## 2018-09-12 NOTE — Telephone Encounter (Signed)
Notes recorded by Frederik Schmidt, RN on 09/12/2018 at 11:08 AM EST Mailbox full 1/8 ------

## 2018-09-12 NOTE — Telephone Encounter (Signed)
Notes recorded by Frederik Schmidt, RN on 09/12/2018 at 1:15 PM EST The patient has been notified of the result and verbalized understanding. All questions (if any) were answered. Frederik Schmidt, RN 09/12/2018 1:15 PM

## 2018-09-12 NOTE — Telephone Encounter (Signed)
-----   Message from Consuelo Pandy, [place] sent at 09/12/2018 10:54 AM EST ----- Following up on results. Agree with laura's report. Since her calcium score is 0,  we will not need to add any statins at this time. I recommend trying to control cholesterol through diet and exercise. Her PCP can check her lipids, at least once yearly. She should strive to keep her LDL (bad cholesterol) < 100.

## 2018-09-24 ENCOUNTER — Other Ambulatory Visit: Payer: Self-pay | Admitting: Family Medicine

## 2018-09-24 ENCOUNTER — Other Ambulatory Visit (HOSPITAL_COMMUNITY)
Admission: RE | Admit: 2018-09-24 | Discharge: 2018-09-24 | Disposition: A | Discharge status: Home / Self Care | Payer: BC Managed Care – PPO | Source: Ambulatory Visit | Attending: Family Medicine | Admitting: Family Medicine

## 2018-09-24 DIAGNOSIS — Z0000 Encounter for general adult medical examination without abnormal findings: Secondary | ICD-10-CM | POA: Diagnosis present

## 2018-09-28 LAB — CYTOLOGY - PAP: Diagnosis: NEGATIVE

## 2018-10-02 ENCOUNTER — Encounter (INDEPENDENT_AMBULATORY_CARE_PROVIDER_SITE_OTHER): Payer: Self-pay | Admitting: Orthopaedic Surgery

## 2018-10-02 ENCOUNTER — Ambulatory Visit (INDEPENDENT_AMBULATORY_CARE_PROVIDER_SITE_OTHER): Payer: BC Managed Care – PPO | Admitting: Orthopaedic Surgery

## 2018-10-02 ENCOUNTER — Ambulatory Visit (INDEPENDENT_AMBULATORY_CARE_PROVIDER_SITE_OTHER): Payer: BC Managed Care – PPO

## 2018-10-02 DIAGNOSIS — S92515A Nondisplaced fracture of proximal phalanx of left lesser toe(s), initial encounter for closed fracture: Secondary | ICD-10-CM
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
## 2019-02-21 ENCOUNTER — Telehealth: Payer: Self-pay | Admitting: *Deleted

## 2019-02-21 NOTE — Telephone Encounter (Signed)
Call placed to pt re: 02/28/2019 in office appt.Yvette Stanton was virtual that day and pt only wants in-office visit. Pt rescheduled til the 03/01/2019 and she has answered not to the following question.       COVID-19 Pre-Screening Questions:  . In the past 7 to 10 days have you had a cough,  shortness of breath, headache, congestion, fever (100 or greater) body aches, chills, sore throat, or sudden loss of taste or sense of smell? . Have you been around anyone with known Covid 19. . Have you been around anyone who is awaiting Covid 19 test results in the past 7 to 10 days? . Have you been around anyone who has been exposed to Covid 19, or has mentioned symptoms of Covid 19 within the past 7 to 10 days?  If you have any concerns/questions about symptoms patients report during screening (either on the phone or at threshold). Contact the provider seeing the patient or DOD for further guidance.  If neither are available contact a member of the leadership team.

## 2019-02-28 ENCOUNTER — Ambulatory Visit: Payer: BC Managed Care – PPO | Admitting: Physician Assistant

## 2019-02-28 ENCOUNTER — Encounter: Payer: Self-pay | Admitting: Physician Assistant

## 2019-02-28 ENCOUNTER — Telehealth: Payer: Self-pay | Admitting: Physician Assistant
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
## 2019-02-28 NOTE — Telephone Encounter (Signed)
New Message     COVID SCREENING QUESTIONS FOR IN-OFFICE VISITS - PLEASE DOCUMENT PATIENT ANSWERS  1. Do you currently have a fever?  a. NO  2. Have you recently traveled on a cruise, internationally, or to [place], [place], [place], [place], [place], or [place], [place] [employer])?  a. NO  3. Have you been in contact with someone that is currently pending confirmation of Covid19 testing or has been confirmed to have the [place] virus?  a. NO  4. Are you currently experiencing fatigue or cough?  a. NO

## 2019-03-01 ENCOUNTER — Ambulatory Visit: Payer: BC Managed Care – PPO | Admitting: Physician Assistant

## 2019-03-06 ENCOUNTER — Telehealth: Payer: Self-pay | Admitting: Physician Assistant

## 2019-03-06 NOTE — Telephone Encounter (Signed)
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
## 2019-03-11 ENCOUNTER — Ambulatory Visit (INDEPENDENT_AMBULATORY_CARE_PROVIDER_SITE_OTHER): Payer: BC Managed Care – PPO | Admitting: Physician Assistant

## 2019-03-11 ENCOUNTER — Other Ambulatory Visit: Payer: Self-pay

## 2019-03-11 ENCOUNTER — Encounter: Payer: Self-pay | Admitting: Physician Assistant

## 2019-03-11 VITALS — BP 110/72 | HR 57 | Ht 64.0 in | Wt 169.8 lb

## 2019-03-11 DIAGNOSIS — E785 Hyperlipidemia, unspecified: Secondary | ICD-10-CM | POA: Diagnosis not present

## 2019-03-11 DIAGNOSIS — I491 Atrial premature depolarization: Secondary | ICD-10-CM | POA: Diagnosis not present

## 2019-03-11 DIAGNOSIS — Z87898 Personal history of other specified conditions: Secondary | ICD-10-CM

## 2019-03-11 DIAGNOSIS — I471 Supraventricular tachycardia: Secondary | ICD-10-CM

## 2019-03-11 DIAGNOSIS — I493 Ventricular premature depolarization: Secondary | ICD-10-CM

## 2019-03-11 LAB — HEPATIC FUNCTION PANEL
ALT: 11 IU/L (ref 0–32)
AST: 20 IU/L (ref 0–40)
Albumin: 4.5 g/dL (ref 3.8–4.9)
Alkaline Phosphatase: 64 IU/L (ref 39–117)
Bilirubin Total: 0.7 mg/dL (ref 0.0–1.2)
Bilirubin, Direct: 0.19 mg/dL (ref 0.00–0.40)
Total Protein: 6.6 g/dL (ref 6.0–8.5)

## 2019-03-11 LAB — LIPID PANEL
Chol/HDL Ratio: 2.1 ratio (ref 0.0–4.4)
Cholesterol, Total: 209 mg/dL — ABNORMAL HIGH (ref 100–199)
HDL: 98 mg/dL (ref >39)
LDL Calculated: 96 mg/dL (ref 0–99)
Triglycerides: 76 mg/dL (ref 0–149)
VLDL Cholesterol Cal: 15 mg/dL (ref 5–40)

## 2019-03-11 NOTE — Patient Instructions (Signed)
Medication Instructions:  Your physician recommends that you continue on your current medications as directed. Please refer to the Current Medication list given to you today.  If you need a refill on your cardiac medications before your next appointment, please call your pharmacy.   Lab work: TODAY:  LIPID & LFT  If you have labs (blood work) drawn today and your tests are completely normal, you will receive your results only by: Marland Kitchen MyChart Message (if you have MyChart) OR . A paper copy in the mail If you have any lab test that is abnormal or we need to change your treatment, we will call you to review the results.  Testing/Procedures: None ordered  Follow-Up: At [facility], you and your health needs are our priority.  As part of our continuing mission to provide you with exceptional heart care, we have created designated Provider Care Teams.  These Care Teams include your primary Cardiologist (physician) and Advanced Practice Providers (APPs -  Physician Assistants and Nurse Practitioners) who all work together to provide you with the care you need, when you need it. You will need a follow up appointment in 12 months.  Please call our office 2 months in advance to schedule this appointment.  You may see Ena Dawley, MD or one of the following Advanced Practice Providers on your designated Care Team:   [place], PA-C Melina Copa, PA-C . Ermalinda Barrios, PA-C  Any Other Special Instructions Will Be Listed Below (If Applicable).

## 2019-03-19 ENCOUNTER — Telehealth: Payer: Self-pay | Admitting: Physician Assistant

## 2019-03-19 NOTE — Telephone Encounter (Signed)
Returned pts call and discussed her lab results from her recent office visit. Pt thanked me for the return call.

## 2019-03-19 NOTE — Telephone Encounter (Signed)
New Message    Patient states they where speaking about her Cholesterol at her last appt but she forgot what the doctor said about it and would like a nurse to call her back.

## 2019-03-28 ENCOUNTER — Other Ambulatory Visit: Payer: Self-pay | Admitting: Family Medicine

## 2019-03-28 DIAGNOSIS — Z1231 Encounter for screening mammogram for malignant neoplasm of breast: Secondary | ICD-10-CM

## 2019-05-10 ENCOUNTER — Ambulatory Visit: Payer: BC Managed Care – PPO

## 2019-06-24 ENCOUNTER — Other Ambulatory Visit: Payer: Self-pay

## 2019-06-24 ENCOUNTER — Ambulatory Visit
Admission: RE | Admit: 2019-06-24 | Discharge: 2019-06-24 | Disposition: A | Discharge status: Home / Self Care | Payer: BC Managed Care – PPO | Source: Ambulatory Visit | Attending: Family Medicine | Admitting: Family Medicine

## 2019-06-24 DIAGNOSIS — Z1231 Encounter for screening mammogram for malignant neoplasm of breast: Secondary | ICD-10-CM

## 2019-07-04 ENCOUNTER — Telehealth: Payer: Self-pay | Admitting: Cardiology

## 2019-07-04 NOTE — Telephone Encounter (Signed)
Pt is calling in to ask when she will be due back to see someone in our office.  Informed the pt that according to [place] Dunn's PA-C last OV note with the pt, she will be due to see [place] back in one year, so next July.  Informed the pt that she will get a letter in the mail 2 months prior to that time, to call into the office to schedule that appt.  Pt verbalized understanding and agrees with this plan. Pt was more than gracious for all the assistance provided.

## 2019-07-04 NOTE — Telephone Encounter (Signed)
New Message    Pt is calling because she wanted to go over her last visit with a nurse     Please call back

## 2020-01-17 ENCOUNTER — Other Ambulatory Visit: Payer: Self-pay | Admitting: Family Medicine

## 2020-01-17 DIAGNOSIS — R1032 Left lower quadrant pain: Secondary | ICD-10-CM

## 2020-02-04 ENCOUNTER — Other Ambulatory Visit: Payer: BC Managed Care – PPO

## 2020-02-18 ENCOUNTER — Ambulatory Visit
Admission: RE | Admit: 2020-02-18 | Discharge: 2020-02-18 | Disposition: A | Discharge status: Home / Self Care | Payer: BC Managed Care – PPO | Source: Ambulatory Visit | Attending: Family Medicine | Admitting: Family Medicine

## 2020-02-18 DIAGNOSIS — R1032 Left lower quadrant pain: Secondary | ICD-10-CM

## 2020-02-18 MED ORDER — IOPAMIDOL (ISOVUE-300) INJECTION 61%
100.0000 mL | Freq: Once | INTRAVENOUS | Status: AC | PRN
  Administered 2020-02-18: 100 mL via INTRAVENOUS
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
## 2020-02-26 ENCOUNTER — Ambulatory Visit (INDEPENDENT_AMBULATORY_CARE_PROVIDER_SITE_OTHER): Payer: BC Managed Care – PPO | Admitting: Physician Assistant

## 2020-02-26 ENCOUNTER — Other Ambulatory Visit: Payer: Self-pay

## 2020-02-26 ENCOUNTER — Encounter: Payer: Self-pay | Admitting: Physician Assistant

## 2020-02-26 ENCOUNTER — Telehealth: Payer: Self-pay | Admitting: Cardiology

## 2020-02-26 VITALS — BP 100/70 | HR 56 | Ht 64.0 in | Wt 170.6 lb

## 2020-02-26 DIAGNOSIS — Z87898 Personal history of other specified conditions: Secondary | ICD-10-CM

## 2020-02-26 DIAGNOSIS — I471 Supraventricular tachycardia: Secondary | ICD-10-CM | POA: Diagnosis not present

## 2020-02-26 DIAGNOSIS — I493 Ventricular premature depolarization: Secondary | ICD-10-CM

## 2020-02-26 DIAGNOSIS — E785 Hyperlipidemia, unspecified: Secondary | ICD-10-CM | POA: Diagnosis not present

## 2020-02-26 DIAGNOSIS — I491 Atrial premature depolarization: Secondary | ICD-10-CM | POA: Diagnosis not present

## 2020-02-26 NOTE — Telephone Encounter (Signed)
Called and spoke to the patient and made her aware that Ermalinda Barrios would like to continue to take the omega-3 given her last lipid results. Patient verbalized understanding and thanked me for the call.

## 2020-02-26 NOTE — Patient Instructions (Signed)
Medication Instructions:  Your physician recommends that you continue on your current medications as directed. Please refer to the Current Medication list given to you today.  *If you need a refill on your cardiac medications before your next appointment, please call your pharmacy*   Lab Work: None  If you have labs (blood work) drawn today and your tests are completely normal, you will receive your results only by: Marland Kitchen MyChart Message (if you have MyChart) OR . A paper copy in the mail If you have any lab test that is abnormal or we need to change your treatment, we will call you to review the results.   Testing/Procedures: None   Follow-Up: At [facility], you and your health needs are our priority.  As part of our continuing mission to provide you with exceptional heart care, we have created designated Provider Care Teams.  These Care Teams include your primary Cardiologist (physician) and Advanced Practice Providers (APPs -  Physician Assistants and Nurse Practitioners) who all work together to provide you with the care you need, when you need it.  We recommend signing up for the patient portal called "MyChart".  Sign up information is provided on this After Visit Summary.  MyChart is used to connect with patients for Virtual Visits (Telemedicine).  Patients are able to view lab/test results, encounter notes, upcoming appointments, etc.  Non-urgent messages can be sent to your provider as well.   To learn more about what you can do with MyChart, go to [email or web address].    Your next appointment:   12 month(s)  The format for your next appointment:   In Person  Provider:   You may see Ena Dawley, MD or one of the following Advanced Practice Providers on your designated Care Team:    Melina Copa, PA-C  Ermalinda Barrios, PA-C    Other Instructions Decrease caffeine, sugar, and processed foods.  Your provider recommends that you maintain 150 minutes per week of  moderate aerobic activity.

## 2020-02-26 NOTE — Telephone Encounter (Signed)
Patient was in the office today for an office visit, she saw Yvette Stanton.  She would like to ask Dr. Meda Coffee if it necessary to continue taking the Omega-3 every day.

## 2020-06-23 ENCOUNTER — Other Ambulatory Visit: Payer: Self-pay | Admitting: Family Medicine

## 2020-06-23 DIAGNOSIS — Z1231 Encounter for screening mammogram for malignant neoplasm of breast: Secondary | ICD-10-CM

## 2020-07-24 ENCOUNTER — Ambulatory Visit: Payer: BC Managed Care – PPO

## 2020-08-13 ENCOUNTER — Ambulatory Visit
Admission: RE | Admit: 2020-08-13 | Discharge: 2020-08-13 | Disposition: A | Discharge status: Home / Self Care | Payer: BC Managed Care – PPO | Source: Ambulatory Visit | Attending: Family Medicine | Admitting: Family Medicine

## 2020-08-13 ENCOUNTER — Other Ambulatory Visit: Payer: Self-pay

## 2020-08-13 DIAGNOSIS — Z1231 Encounter for screening mammogram for malignant neoplasm of breast: Secondary | ICD-10-CM

## 2020-11-19 ENCOUNTER — Other Ambulatory Visit: Payer: Self-pay

## 2020-11-19 ENCOUNTER — Ambulatory Visit
Admission: EM | Admit: 2020-11-19 | Discharge: 2020-11-19 | Disposition: A | Discharge status: Home / Self Care | Payer: BC Managed Care – PPO | Attending: Family Medicine | Admitting: Family Medicine

## 2020-11-19 DIAGNOSIS — R509 Fever, unspecified: Secondary | ICD-10-CM

## 2020-11-19 DIAGNOSIS — R000 Tachycardia, unspecified: Secondary | ICD-10-CM

## 2020-11-19 DIAGNOSIS — B349 Viral infection, unspecified: Secondary | ICD-10-CM

## 2020-11-19 NOTE — ED Triage Notes (Signed)
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

## 2020-11-19 NOTE — Discharge Instructions (Signed)
Your COVID and flu test is pending.  You should self quarantine until the test result is back.    Take Tylenol or ibuprofen as needed for fever or discomfort.  Rest and keep yourself hydrated.    Follow-up with your primary care provider if your symptoms are not improving.
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
## 2020-11-20 LAB — COVID-19, FLU A+B NAA
Influenza A, NAA: NOT DETECTED
Influenza B, NAA: NOT DETECTED
SARS-CoV-2, NAA: NOT DETECTED

## 2020-12-11 ENCOUNTER — Encounter (HOSPITAL_BASED_OUTPATIENT_CLINIC_OR_DEPARTMENT_OTHER): Payer: Self-pay | Admitting: *Deleted

## 2020-12-11 ENCOUNTER — Other Ambulatory Visit: Payer: Self-pay

## 2020-12-11 ENCOUNTER — Emergency Department (HOSPITAL_BASED_OUTPATIENT_CLINIC_OR_DEPARTMENT_OTHER)
Admission: EM | Admit: 2020-12-11 | Discharge: 2020-12-11 | Disposition: A | Discharge status: Home / Self Care | Payer: BC Managed Care – PPO | Attending: Emergency Medicine | Admitting: Emergency Medicine

## 2020-12-11 DIAGNOSIS — R5383 Other fatigue: Secondary | ICD-10-CM

## 2020-12-11 DIAGNOSIS — R000 Tachycardia, unspecified: Secondary | ICD-10-CM | POA: Insufficient documentation

## 2020-12-11 DIAGNOSIS — E0590 Thyrotoxicosis, unspecified without thyrotoxic crisis or storm: Secondary | ICD-10-CM | POA: Insufficient documentation

## 2020-12-11 LAB — URINALYSIS, ROUTINE W REFLEX MICROSCOPIC
Bilirubin Urine: NEGATIVE
Glucose, UA: NEGATIVE mg/dL
Hgb urine dipstick: NEGATIVE
Ketones, ur: NEGATIVE mg/dL
Leukocytes,Ua: NEGATIVE
Nitrite: NEGATIVE
Protein, ur: NEGATIVE mg/dL
Specific Gravity, Urine: 1.02 (ref 1.005–1.030)
pH: 7 (ref 5.0–8.0)

## 2020-12-11 LAB — CBC WITH DIFFERENTIAL/PLATELET
Abs Immature Granulocytes: 0.01 10*3/uL (ref 0.00–0.07)
Basophils Absolute: 0 10*3/uL (ref 0.0–0.1)
Basophils Relative: 0 %
Eosinophils Absolute: 0 10*3/uL (ref 0.0–0.5)
Eosinophils Relative: 0 %
HCT: 30.7 % — ABNORMAL LOW (ref 36.0–46.0)
Hemoglobin: 10 g/dL — ABNORMAL LOW (ref 12.0–15.0)
Immature Granulocytes: 0 %
Lymphocytes Relative: 31 %
Lymphs Abs: 1.1 10*3/uL (ref 0.7–4.0)
MCH: 27 pg (ref 26.0–34.0)
MCHC: 32.6 g/dL (ref 30.0–36.0)
MCV: 83 fL (ref 80.0–100.0)
Monocytes Absolute: 0.7 10*3/uL (ref 0.1–1.0)
Monocytes Relative: 18 %
Neutro Abs: 1.8 10*3/uL (ref 1.7–7.7)
Neutrophils Relative %: 51 %
Platelets: 229 10*3/uL (ref 150–400)
RBC: 3.7 MIL/uL — ABNORMAL LOW (ref 3.87–5.11)
RDW: 12 % (ref 11.5–15.5)
WBC: 3.6 10*3/uL — ABNORMAL LOW (ref 4.0–10.5)
nRBC: 0 % (ref 0.0–0.2)

## 2020-12-11 LAB — COMPREHENSIVE METABOLIC PANEL
ALT: 22 U/L (ref 0–44)
AST: 24 U/L (ref 15–41)
Albumin: 3.4 g/dL — ABNORMAL LOW (ref 3.5–5.0)
Alkaline Phosphatase: 64 U/L (ref 38–126)
Anion gap: 10 (ref 5–15)
BUN: 13 mg/dL (ref 6–20)
CO2: 25 mmol/L (ref 22–32)
Calcium: 9.1 mg/dL (ref 8.9–10.3)
Chloride: 105 mmol/L (ref 98–111)
Creatinine, Ser: 0.51 mg/dL (ref 0.44–1.00)
GFR, Estimated: >60 mL/min (ref >60)
Glucose, Bld: 111 mg/dL — ABNORMAL HIGH (ref 70–99)
Potassium: 3.8 mmol/L (ref 3.5–5.1)
Sodium: 140 mmol/L (ref 135–145)
Total Bilirubin: 0.4 mg/dL (ref 0.3–1.2)
Total Protein: 6.3 g/dL — ABNORMAL LOW (ref 6.5–8.1)

## 2020-12-11 LAB — TSH: TSH: <0.01 u[IU]/mL — ABNORMAL LOW (ref 0.350–4.500)

## 2020-12-11 MED ORDER — LACTATED RINGERS IV BOLUS
1000.0000 mL | Freq: Once | INTRAVENOUS | Status: AC
Start: 2020-12-11 — End: 2020-12-11
  Administered 2020-12-11: 1000 mL via INTRAVENOUS

## 2020-12-11 MED ORDER — METOPROLOL TARTRATE 25 MG PO TABS
12.5000 mg | ORAL_TABLET | Freq: Once | ORAL | Status: AC
  Administered 2020-12-11: 12.5 mg via ORAL
  Filled 2020-12-11: qty 1

## 2020-12-11 MED ORDER — METOPROLOL TARTRATE 25 MG PO TABS: 12.5000 mg | ORAL_TABLET | Freq: Two times a day (BID) | ORAL | 0 refills | Status: DC

## 2020-12-11 NOTE — Discharge Instructions (Signed)
Your lab work today looked relatively normal but your thyroid tests will not return today.  They should be available on your MyChart account by tomorrow or Sunday.  In the meantime we will start the heart rate controlling medication which will hopefully make you feel better and less tired.
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
## 2020-12-11 NOTE — ED Provider Notes (Signed)
MSE was initiated and I personally evaluated the patient and placed orders (if any) at  6:56 PM on December 11, 2020.  The patient appears stable so that the remainder of the MSE may be completed by another provider.  Patient placed in Quick Look pathway, seen and evaluated   Chief Complaint: fatigue, body aches  HPI:   60 y.o. female who presents for evaluation of generalized fatigue, body aches that have been ongoing for the last week.  She reports that about a month ago, she was told that she had hypothyroidism.  She was   ROS: fatigue  Physical Exam:   Gen: No distress  Neuro: Awake and Alert  Skin: Warm    Focused Exam: RRR, CTAB   Initiation of care has begun. The patient has been counseled on the process, plan, and necessity for staying for the completion/evaluation, and the remainder of the medical screening examination    Desma Mcgregor 12/11/20 1933    Blanchie Dessert, MD 12/11/20 2325

## 2020-12-11 NOTE — ED Triage Notes (Addendum)
C/o generalized fatigue with palpitations x 1  month newly DX hypothyroidism , scheduled  for endocrinologist appt  April 13th

## 2020-12-13 LAB — T4: T4, Total: 17.8 ug/dL — ABNORMAL HIGH (ref 4.5–12.0)

## 2020-12-15 ENCOUNTER — Telehealth: Payer: Self-pay | Admitting: Internal Medicine

## 2020-12-15 ENCOUNTER — Ambulatory Visit: Payer: BC Managed Care – PPO | Admitting: Internal Medicine

## 2020-12-15 ENCOUNTER — Other Ambulatory Visit: Payer: Self-pay

## 2020-12-15 ENCOUNTER — Encounter: Payer: Self-pay | Admitting: Internal Medicine

## 2020-12-15 VITALS — BP 130/72 | HR 110 | Ht 64.0 in | Wt 172.5 lb

## 2020-12-15 DIAGNOSIS — E0590 Thyrotoxicosis, unspecified without thyrotoxic crisis or storm: Secondary | ICD-10-CM | POA: Diagnosis not present

## 2020-12-15 LAB — CBC WITH DIFFERENTIAL/PLATELET
Basophils Absolute: 0 10*3/uL (ref 0.0–0.1)
Basophils Relative: 0.4 % (ref 0.0–3.0)
Eosinophils Absolute: 0 10*3/uL (ref 0.0–0.7)
Eosinophils Relative: 0.2 % (ref 0.0–5.0)
HCT: 30.3 % — ABNORMAL LOW (ref 36.0–46.0)
Hemoglobin: 9.8 g/dL — ABNORMAL LOW (ref 12.0–15.0)
Lymphocytes Relative: 28.3 % (ref 12.0–46.0)
Lymphs Abs: 0.9 10*3/uL (ref 0.7–4.0)
MCHC: 32.4 g/dL (ref 30.0–36.0)
MCV: 80.6 fl (ref 78.0–100.0)
Monocytes Absolute: 0.4 10*3/uL (ref 0.1–1.0)
Monocytes Relative: 13.2 % — ABNORMAL HIGH (ref 3.0–12.0)
Neutro Abs: 1.8 10*3/uL (ref 1.4–7.7)
Neutrophils Relative %: 57.9 % (ref 43.0–77.0)
Platelets: 206 10*3/uL (ref 150.0–400.0)
RBC: 3.75 Mil/uL — ABNORMAL LOW (ref 3.87–5.11)
RDW: 12.8 % (ref 11.5–15.5)
WBC: 3 10*3/uL — ABNORMAL LOW (ref 4.0–10.5)

## 2020-12-15 LAB — COMPREHENSIVE METABOLIC PANEL
ALT: 17 U/L (ref 0–35)
AST: 16 U/L (ref 0–37)
Albumin: 3.4 g/dL — ABNORMAL LOW (ref 3.5–5.2)
Alkaline Phosphatase: 69 U/L (ref 39–117)
BUN: 9 mg/dL (ref 6–23)
CO2: 30 mEq/L (ref 19–32)
Calcium: 9.5 mg/dL (ref 8.4–10.5)
Chloride: 104 mEq/L (ref 96–112)
Creatinine, Ser: 0.48 mg/dL (ref 0.40–1.20)
GFR: 103.38 mL/min (ref >60.00)
Glucose, Bld: 109 mg/dL — ABNORMAL HIGH (ref 70–99)
Potassium: 4 mEq/L (ref 3.5–5.1)
Sodium: 141 mEq/L (ref 135–145)
Total Bilirubin: 0.7 mg/dL (ref 0.2–1.2)
Total Protein: 5.8 g/dL — ABNORMAL LOW (ref 6.0–8.3)

## 2020-12-15 LAB — T4, FREE: Free T4: 4.89 ng/dL — ABNORMAL HIGH (ref 0.60–1.60)

## 2020-12-15 LAB — TSH: TSH: <0.01 u[IU]/mL — ABNORMAL LOW (ref 0.35–4.50)

## 2020-12-15 MED ORDER — ATENOLOL 25 MG PO TABS: 25.0000 mg | ORAL_TABLET | Freq: Every day | ORAL | 1 refills | Status: DC

## 2020-12-15 NOTE — Patient Instructions (Signed)
We recommend that you follow these hyperthyroidism instructions at home:  1) Take Methimazole 5 mg twice  a day  If you develop severe sore throat with high fevers OR develop unexplained yellowing of your skin, eyes, under your tongue, severe abdominal pain with nausea or vomiting --> then please get evaluated immediately.  2) Atenolol 25 mg one time a day 3) Get repeat thyroid labs 6 weeks .   It is ESSENTIAL to get follow-up labs to help avoid over or undertreatment of your hyperthyroidism - both of which can be dangerous to your health.

## 2020-12-15 NOTE — Telephone Encounter (Signed)
Talked to dr & cma about note after my lunch, we cannot write note for week ONLY today(visit date only) I will call and let her know   shamleffer stated to call patient and let them know.   If they are needing something for the week to follow up with PCP for that reason
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
## 2020-12-15 NOTE — Telephone Encounter (Signed)
We could will only excuse patient from work for today 12/15/2020. We cannot give patient the whole week as requested.  Patient notified. Verbalized understanding.

## 2020-12-15 NOTE — Telephone Encounter (Signed)
She asked for a note for today, is that not done at the front ?

## 2020-12-15 NOTE — Telephone Encounter (Signed)
Please advise 

## 2020-12-15 NOTE — Telephone Encounter (Signed)
Patient is wanting a work note for the week   I know we are not allowed to write one for over one day  Can we get one done for her? And send it thru [employer] can call her if any questions

## 2020-12-16 ENCOUNTER — Ambulatory Visit
Admission: RE | Admit: 2020-12-16 | Discharge: 2020-12-16 | Disposition: A | Discharge status: Home / Self Care | Payer: BC Managed Care – PPO | Source: Ambulatory Visit | Attending: Internal Medicine | Admitting: Internal Medicine

## 2020-12-16 ENCOUNTER — Telehealth: Payer: Self-pay | Admitting: Family Medicine

## 2020-12-16 ENCOUNTER — Ambulatory Visit: Payer: BC Managed Care – PPO | Admitting: Internal Medicine

## 2020-12-16 DIAGNOSIS — E0590 Thyrotoxicosis, unspecified without thyrotoxic crisis or storm: Secondary | ICD-10-CM

## 2020-12-16 NOTE — Telephone Encounter (Signed)
Please advise 

## 2020-12-16 NOTE — Telephone Encounter (Signed)
I already explained to the patient yesterday that she can continue the supplements that she is on in addition to the current medications. Please make sure that she start her methimazole 5 mg twice a day  Please let her know that there is no clinical indication to do her T3 because her T4 is already elevated and we already established that she needs treatment

## 2020-12-16 NOTE — Telephone Encounter (Signed)
Pt would like a call back in reference to her appt on yesterday, states that she though Dr. Kelton Pillar had mentioned to her that she wanted her to get a T3 lab done and she states that she don't think they did it. Also she would like to know if its ok for her to take Omega Oil, Vitamin D, and Iron pills with her medication atenolol.

## 2020-12-17 ENCOUNTER — Encounter: Payer: Self-pay | Admitting: Internal Medicine

## 2020-12-17 NOTE — Telephone Encounter (Signed)
Spoken to patient and notified Dr Shamleffer's comments. Verbalized understanding.   

## 2020-12-18 LAB — TRAB (TSH RECEPTOR BINDING ANTIBODY): TRAB: 7.98 IU/L — ABNORMAL HIGH (ref <=2.00)

## 2020-12-24 ENCOUNTER — Encounter: Payer: Self-pay | Admitting: Internal Medicine

## 2020-12-25 ENCOUNTER — Encounter: Payer: Self-pay | Admitting: Internal Medicine

## 2020-12-26 ENCOUNTER — Other Ambulatory Visit: Payer: Self-pay

## 2020-12-26 ENCOUNTER — Ambulatory Visit
Admission: EM | Admit: 2020-12-26 | Discharge: 2020-12-26 | Disposition: A | Discharge status: Home / Self Care | Payer: BC Managed Care – PPO | Attending: Family Medicine | Admitting: Family Medicine

## 2020-12-26 ENCOUNTER — Encounter: Payer: Self-pay | Admitting: Emergency Medicine

## 2020-12-26 DIAGNOSIS — M545 Low back pain, unspecified: Secondary | ICD-10-CM

## 2020-12-26 LAB — POCT URINALYSIS DIP (MANUAL ENTRY)
Bilirubin, UA: NEGATIVE
Blood, UA: NEGATIVE
Glucose, UA: NEGATIVE mg/dL
Ketones, POC UA: NEGATIVE mg/dL
Leukocytes, UA: NEGATIVE
Nitrite, UA: NEGATIVE
Protein Ur, POC: NEGATIVE mg/dL
Spec Grav, UA: 1.025 (ref 1.010–1.025)
Urobilinogen, UA: 1 E.U./dL
pH, UA: 6 (ref 5.0–8.0)

## 2020-12-26 MED ORDER — CYCLOBENZAPRINE HCL 10 MG PO TABS: ORAL_TABLET | ORAL | 0 refills | Status: DC

## 2020-12-26 MED ORDER — DICLOFENAC SODIUM 75 MG PO TBEC: 75.0000 mg | DELAYED_RELEASE_TABLET | Freq: Two times a day (BID) | ORAL | 0 refills | Status: DC

## 2020-12-26 MED ORDER — DICLOFENAC SODIUM 1 % EX GEL: 2.0000 g | Freq: Four times a day (QID) | CUTANEOUS | 0 refills | Status: DC

## 2020-12-26 NOTE — ED Triage Notes (Addendum)
Patient c/o bilateral lower back pain x 3 days.    Patient endorses increased urinary frequency.   Patient denies any dysuria, hematuria, and foul smell.   Patient denies any fall or trauma.   Patient denies any previous history of back problems.   Patient denies any factors that make the pain worst.   Patient has used Tylenol with some relief of symptoms.

## 2020-12-26 NOTE — Discharge Instructions (Addendum)
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
## 2021-01-01 ENCOUNTER — Ambulatory Visit: Payer: BC Managed Care – PPO | Admitting: Internal Medicine

## 2021-01-11 ENCOUNTER — Encounter: Payer: Self-pay | Admitting: Cardiology

## 2021-01-11 ENCOUNTER — Other Ambulatory Visit: Payer: Self-pay

## 2021-01-11 ENCOUNTER — Ambulatory Visit (INDEPENDENT_AMBULATORY_CARE_PROVIDER_SITE_OTHER): Payer: BC Managed Care – PPO | Admitting: Cardiology

## 2021-01-11 VITALS — BP 114/62 | HR 66 | Ht 64.0 in | Wt 162.0 lb

## 2021-01-11 DIAGNOSIS — I471 Supraventricular tachycardia: Secondary | ICD-10-CM | POA: Diagnosis not present

## 2021-01-11 DIAGNOSIS — I493 Ventricular premature depolarization: Secondary | ICD-10-CM

## 2021-01-11 DIAGNOSIS — E7800 Pure hypercholesterolemia, unspecified: Secondary | ICD-10-CM

## 2021-01-11 DIAGNOSIS — Z87898 Personal history of other specified conditions: Secondary | ICD-10-CM

## 2021-01-11 NOTE — Patient Instructions (Signed)
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
## 2021-01-12 ENCOUNTER — Encounter: Payer: Self-pay | Admitting: Internal Medicine

## 2021-01-12 MED ORDER — METHIMAZOLE 5 MG PO TABS: 5.0000 mg | ORAL_TABLET | Freq: Two times a day (BID) | ORAL | 1 refills | Status: DC

## 2021-01-19 ENCOUNTER — Other Ambulatory Visit (INDEPENDENT_AMBULATORY_CARE_PROVIDER_SITE_OTHER): Payer: BC Managed Care – PPO

## 2021-01-19 ENCOUNTER — Other Ambulatory Visit: Payer: Self-pay

## 2021-01-19 ENCOUNTER — Encounter: Payer: Self-pay | Admitting: Internal Medicine

## 2021-01-19 DIAGNOSIS — E0590 Thyrotoxicosis, unspecified without thyrotoxic crisis or storm: Secondary | ICD-10-CM

## 2021-01-19 LAB — TSH: TSH: <0.01 u[IU]/mL — ABNORMAL LOW (ref 0.35–4.50)

## 2021-01-19 LAB — T4, FREE: Free T4: 0.93 ng/dL (ref 0.60–1.60)

## 2021-01-20 MED ORDER — ATENOLOL 25 MG PO TABS: 25.0000 mg | ORAL_TABLET | ORAL | 3 refills | Status: DC | PRN

## 2021-01-21 NOTE — Telephone Encounter (Signed)
She needs an office visit. She should have been scheduled 3 months from her last visit with me

## 2021-02-09 ENCOUNTER — Telehealth: Payer: Self-pay | Admitting: Internal Medicine

## 2021-02-09 ENCOUNTER — Encounter: Payer: Self-pay | Admitting: Internal Medicine

## 2021-02-09 NOTE — Telephone Encounter (Signed)
Patient called back and was inform Dr Kelton Pillar is out of the office. Insisted on getting a call back sooner then waiting for Dr Kelton Pillar to respond.

## 2021-02-09 NOTE — Telephone Encounter (Signed)
Pt has a cold and is taking methimazole (TAPAZOLE) 5 MG tablet and is wondering what cold medication she can take with being on Tapazole

## 2021-02-10 NOTE — Telephone Encounter (Signed)
Noted  

## 2021-02-12 ENCOUNTER — Telehealth: Payer: Self-pay | Admitting: Internal Medicine

## 2021-02-12 NOTE — Telephone Encounter (Signed)
Patient called and wants to know if she can lay out in the sun since she is taking Methimazole (Tapazole).  Requests call back to [phone].  Patient also advise that her pharmacy could be a resource for this information as well as out office.

## 2021-02-14 ENCOUNTER — Encounter: Payer: Self-pay | Admitting: Internal Medicine

## 2021-02-16 NOTE — Telephone Encounter (Signed)
Called pt and informed of Dr Kelton Pillar response to laying in the sun.

## 2021-03-24 ENCOUNTER — Encounter: Payer: Self-pay | Admitting: Internal Medicine

## 2021-03-24 ENCOUNTER — Other Ambulatory Visit: Payer: Self-pay

## 2021-03-24 ENCOUNTER — Ambulatory Visit (INDEPENDENT_AMBULATORY_CARE_PROVIDER_SITE_OTHER): Payer: BC Managed Care – PPO | Admitting: Internal Medicine

## 2021-03-24 ENCOUNTER — Other Ambulatory Visit (INDEPENDENT_AMBULATORY_CARE_PROVIDER_SITE_OTHER): Payer: BC Managed Care – PPO

## 2021-03-24 VITALS — BP 110/82 | HR 64 | Ht 64.0 in | Wt 167.0 lb

## 2021-03-24 DIAGNOSIS — E0590 Thyrotoxicosis, unspecified without thyrotoxic crisis or storm: Secondary | ICD-10-CM | POA: Diagnosis not present

## 2021-03-24 DIAGNOSIS — E0500 Thyrotoxicosis with diffuse goiter without thyrotoxic crisis or storm: Secondary | ICD-10-CM | POA: Diagnosis not present

## 2021-03-24 DIAGNOSIS — R748 Abnormal levels of other serum enzymes: Secondary | ICD-10-CM | POA: Diagnosis not present

## 2021-03-24 DIAGNOSIS — D709 Neutropenia, unspecified: Secondary | ICD-10-CM | POA: Diagnosis not present

## 2021-03-24 LAB — COMPREHENSIVE METABOLIC PANEL
ALT: 14 U/L (ref 0–35)
AST: 19 U/L (ref 0–37)
Albumin: 4.6 g/dL (ref 3.5–5.2)
Alkaline Phosphatase: 147 U/L — ABNORMAL HIGH (ref 39–117)
BUN: 12 mg/dL (ref 6–23)
CO2: 28 mEq/L (ref 19–32)
Calcium: 9.7 mg/dL (ref 8.4–10.5)
Chloride: 102 mEq/L (ref 96–112)
Creatinine, Ser: 0.85 mg/dL (ref 0.40–1.20)
GFR: 74.64 mL/min (ref >60.00)
Glucose, Bld: 93 mg/dL (ref 70–99)
Potassium: 4.2 mEq/L (ref 3.5–5.1)
Sodium: 140 mEq/L (ref 135–145)
Total Bilirubin: 0.7 mg/dL (ref 0.2–1.2)
Total Protein: 7.4 g/dL (ref 6.0–8.3)

## 2021-03-24 LAB — CBC WITH DIFFERENTIAL/PLATELET
Basophils Absolute: 0 10*3/uL (ref 0.0–0.1)
Basophils Relative: 1.1 % (ref 0.0–3.0)
Eosinophils Absolute: 0 10*3/uL (ref 0.0–0.7)
Eosinophils Relative: 1.1 % (ref 0.0–5.0)
HCT: 37.8 % (ref 36.0–46.0)
Hemoglobin: 12.4 g/dL (ref 12.0–15.0)
Lymphocytes Relative: 41.2 % (ref 12.0–46.0)
Lymphs Abs: 1.8 10*3/uL (ref 0.7–4.0)
MCHC: 33 g/dL (ref 30.0–36.0)
MCV: 80.2 fl (ref 78.0–100.0)
Monocytes Absolute: 0.3 10*3/uL (ref 0.1–1.0)
Monocytes Relative: 7.5 % (ref 3.0–12.0)
Neutro Abs: 2.1 10*3/uL (ref 1.4–7.7)
Neutrophils Relative %: 49.1 % (ref 43.0–77.0)
Platelets: 210 10*3/uL (ref 150.0–400.0)
RBC: 4.71 Mil/uL (ref 3.87–5.11)
RDW: 17.2 % — ABNORMAL HIGH (ref 11.5–15.5)
WBC: 4.3 10*3/uL (ref 4.0–10.5)

## 2021-03-24 LAB — TSH: TSH: 17 u[IU]/mL — ABNORMAL HIGH (ref 0.35–5.50)

## 2021-03-24 LAB — T4, FREE: Free T4: 0.41 ng/dL — ABNORMAL LOW (ref 0.60–1.60)

## 2021-03-24 NOTE — Patient Instructions (Addendum)
We recommend that you follow these hyperthyroidism instructions at home:  1) Take Methimazole 5 mg twice  a day If you develop severe sore throat with high fevers OR develop unexplained yellowing of your skin, eyes, under your tongue, severe abdominal pain with nausea or vomiting --> then please get evaluated immediately.  2) Get repeat thyroid labs 8 weeks .  It is ESSENTIAL to get follow-up labs to help avoid over or undertreatment of your hyperthyroidism - both of which can be dangerous to your health.
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
## 2021-03-25 DIAGNOSIS — R748 Abnormal levels of other serum enzymes: Secondary | ICD-10-CM | POA: Insufficient documentation

## 2021-03-25 DIAGNOSIS — D709 Neutropenia, unspecified: Secondary | ICD-10-CM | POA: Insufficient documentation

## 2021-03-25 DIAGNOSIS — E0500 Thyrotoxicosis with diffuse goiter without thyrotoxic crisis or storm: Secondary | ICD-10-CM | POA: Insufficient documentation

## 2021-03-25 MED ORDER — METHIMAZOLE 5 MG PO TABS: 5.0000 mg | ORAL_TABLET | Freq: Every day | ORAL | 1 refills | Status: DC

## 2021-04-06 ENCOUNTER — Other Ambulatory Visit (INDEPENDENT_AMBULATORY_CARE_PROVIDER_SITE_OTHER): Payer: BC Managed Care – PPO

## 2021-04-06 DIAGNOSIS — E0500 Thyrotoxicosis with diffuse goiter without thyrotoxic crisis or storm: Secondary | ICD-10-CM

## 2021-04-06 LAB — T4, FREE: Free T4: 0.58 ng/dL — ABNORMAL LOW (ref 0.60–1.60)

## 2021-04-06 LAB — TSH: TSH: 2.74 u[IU]/mL (ref 0.35–5.50)

## 2021-04-07 ENCOUNTER — Encounter: Payer: Self-pay | Admitting: Internal Medicine

## 2021-04-18 ENCOUNTER — Ambulatory Visit
Admission: EM | Admit: 2021-04-18 | Discharge: 2021-04-18 | Disposition: A | Discharge status: Home / Self Care | Payer: BC Managed Care – PPO | Attending: Internal Medicine | Admitting: Internal Medicine

## 2021-04-18 ENCOUNTER — Encounter: Payer: Self-pay | Admitting: Emergency Medicine

## 2021-04-18 ENCOUNTER — Other Ambulatory Visit: Payer: Self-pay

## 2021-04-18 DIAGNOSIS — M545 Low back pain, unspecified: Secondary | ICD-10-CM | POA: Insufficient documentation

## 2021-04-18 DIAGNOSIS — R350 Frequency of micturition: Secondary | ICD-10-CM | POA: Insufficient documentation

## 2021-04-18 DIAGNOSIS — S39012A Strain of muscle, fascia and tendon of lower back, initial encounter: Secondary | ICD-10-CM | POA: Diagnosis present

## 2021-04-18 LAB — POCT URINALYSIS DIP (MANUAL ENTRY)
Bilirubin, UA: NEGATIVE
Blood, UA: NEGATIVE
Glucose, UA: NEGATIVE mg/dL
Ketones, POC UA: NEGATIVE mg/dL
Leukocytes, UA: NEGATIVE
Nitrite, UA: NEGATIVE
Protein Ur, POC: NEGATIVE mg/dL
Spec Grav, UA: 1.025 (ref 1.010–1.025)
Urobilinogen, UA: 0.2 E.U./dL
pH, UA: 6 (ref 5.0–8.0)

## 2021-04-18 MED ORDER — KETOROLAC TROMETHAMINE 60 MG/2ML IM SOLN
30.0000 mg | Freq: Once | INTRAMUSCULAR | Status: AC
  Administered 2021-04-18: 30 mg via INTRAMUSCULAR

## 2021-04-18 NOTE — Discharge Instructions (Addendum)
You were given ketorolac injection in urgent care today for pain and inflammation.  Please avoid taking any aspirin, Advil, ibuprofen, Aleve for at least 24 hours following injection.  Also alternate ice and heat application.  Follow-up with provided contact information for orthopedic sports medicine if pain persists in the next 1 to 2 weeks.  Go to the hospital if symptoms significantly worsen or if you develop urinary burning, blood in your urine, increased urinary frequency.  Urine culture is pending and we will call if it is positive.
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
## 2021-04-18 NOTE — ED Triage Notes (Signed)
Patient c/o low back pain since Thursday.  Patient did drag a large tub of items across the yard Wednesday night.  Patient has been taken Aspirin Back and Body, Tylenol. Patient has had increased urinary frequency, no dysuria.

## 2021-04-19 LAB — URINE CULTURE: Culture: NO GROWTH

## 2021-05-17 ENCOUNTER — Other Ambulatory Visit (INDEPENDENT_AMBULATORY_CARE_PROVIDER_SITE_OTHER): Payer: BC Managed Care – PPO

## 2021-05-17 ENCOUNTER — Telehealth: Payer: Self-pay

## 2021-05-17 DIAGNOSIS — E0590 Thyrotoxicosis, unspecified without thyrotoxic crisis or storm: Secondary | ICD-10-CM | POA: Diagnosis not present

## 2021-05-17 NOTE — Telephone Encounter (Signed)
[place] lab has patient in office. Patient is addiment about having labs, but there are no orders. Lab drew Thyroid and CBC (just in case more labs needed). Can someone put labs in for this patient?

## 2021-05-18 ENCOUNTER — Encounter: Payer: Self-pay | Admitting: Internal Medicine

## 2021-05-18 ENCOUNTER — Other Ambulatory Visit: Payer: Self-pay | Admitting: Internal Medicine

## 2021-05-18 ENCOUNTER — Other Ambulatory Visit: Payer: Self-pay

## 2021-05-18 DIAGNOSIS — E0590 Thyrotoxicosis, unspecified without thyrotoxic crisis or storm: Secondary | ICD-10-CM

## 2021-05-18 LAB — COMPREHENSIVE METABOLIC PANEL
ALT: 11 U/L (ref 0–35)
AST: 17 U/L (ref 0–37)
Albumin: 4.5 g/dL (ref 3.5–5.2)
Alkaline Phosphatase: 114 U/L (ref 39–117)
BUN: 9 mg/dL (ref 6–23)
CO2: 31 mEq/L (ref 19–32)
Calcium: 9.6 mg/dL (ref 8.4–10.5)
Chloride: 102 mEq/L (ref 96–112)
Creatinine, Ser: 0.94 mg/dL (ref 0.40–1.20)
GFR: 66.07 mL/min (ref >60.00)
Glucose, Bld: 97 mg/dL (ref 70–99)
Potassium: 4 mEq/L (ref 3.5–5.1)
Sodium: 141 mEq/L (ref 135–145)
Total Bilirubin: 0.5 mg/dL (ref 0.2–1.2)
Total Protein: 7.5 g/dL (ref 6.0–8.3)

## 2021-05-18 LAB — TSH: TSH: 2.68 u[IU]/mL (ref 0.35–5.50)

## 2021-05-18 LAB — T4, FREE: Free T4: 0.87 ng/dL (ref 0.60–1.60)

## 2021-05-20 ENCOUNTER — Other Ambulatory Visit: Payer: BC Managed Care – PPO

## 2021-05-25 ENCOUNTER — Other Ambulatory Visit: Payer: BC Managed Care – PPO

## 2021-06-22 ENCOUNTER — Ambulatory Visit (INDEPENDENT_AMBULATORY_CARE_PROVIDER_SITE_OTHER): Payer: BC Managed Care – PPO | Admitting: Allergy & Immunology

## 2021-06-22 ENCOUNTER — Other Ambulatory Visit: Payer: Self-pay

## 2021-06-22 VITALS — BP 130/72 | HR 59 | Temp 98.1°F | Resp 14 | Ht 64.0 in | Wt 174.4 lb

## 2021-06-22 DIAGNOSIS — K200 Eosinophilic esophagitis: Secondary | ICD-10-CM | POA: Diagnosis not present

## 2021-06-22 NOTE — Patient Instructions (Addendum)
Concern for EoE  - I think we should wait for the second opinion. - You do not have any symptoms of EoE, so I do not want to waste your time right now. - We may consider testing in the future if needed.  - Release of information signed for Dr Leanna Sato office.   2. Return in about 3 months (around 09/22/2021). (We can move this up if needed, depending on the evaluation on Thursday)   Please inform us of any Emergency Department visits, hospitalizations, or changes in symptoms. Call us before going to the ED for breathing or allergy symptoms since we might be able to fit you in for a sick visit. Feel free to contact us anytime with any questions, problems, or concerns.  It was a pleasure to meet you today!  Websites that have reliable patient information: 1. American Academy of Asthma, Allergy, and Immunology: www.aaaai.org 2. Food Allergy Research and Education (FARE): foodallergy.org 3. Mothers of Asthmatics: http://www.asthmacommunitynetwork.org 4. American College of Allergy, Asthma, and Immunology: www.acaai.org   COVID-19 Vaccine Information can be found at: ShippingScam.co.uk For questions related to vaccine distribution or appointments, please email [email or web address] or call [phone].   We realize that you might be concerned about having an allergic reaction to the COVID19 vaccines. To help with that concern, WE ARE OFFERING THE COVID19 VACCINES IN OUR OFFICE! Ask the front desk for dates!     "Like" [place] on Facebook and Instagram for our latest updates!      A healthy democracy works best when [employer] participate! Make sure you are registered to vote! If you have moved or changed any of your contact information, you will need to get this updated before voting!  In some cases, you MAY be able to register to vote online: [email or web address]

## 2021-06-22 NOTE — Progress Notes (Signed)
NEW PATIENT  Date of Service/Encounter:  06/24/21  Consult requested by: Harlan Stains, MD   Assessment:   Eosinophilic esophagitis - did not do testing since she was asymptomatic and she is getting a second opinion (we are going to regroup after that)  Could consider Dupixent in the future, if the Stanton diagnosis is confirmed   Lack of atopic history  Plan/Recommendations:   Concern for Stanton  - I think we should wait for the second opinion. - You do not have any symptoms of Stanton, so I do not want to waste your time right now. - We may consider testing in the future if needed.  - Release of information signed for Dr Leanna Sato office.   2. Return in about 3 months (around 09/22/2021). (We can move this up if needed, depending on the evaluation on Thursday)    This note in its entirety was forwarded to the Provider who requested this consultation.  Subjective:   Yvette Stanton is a 60 y.o. female presenting today for evaluation of  Chief Complaint  Patient presents with   Allergy Testing   Establish Care    Patient in today as a referral.    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  Yvette Stanton was referred by Harlan Stains, MD.     Yvette Stanton is a 60 y.o. female presenting for an evaluation of possible food allergies given a recent diagnosis of Stanton .  She saw GI for a routine colonscopy. She had an endoscopy performed as well. Dr. Dema Severin her PCP recommended that she get checked out for internal bleeding. This was all negative. Then she had a biopy performed that demonstrated elevated eosinophils. She was seen and evaluated by Dr. Otis Brace. She is unsure why she had the endoscopy aside from that history of possible internal bleeding. Her anemia has since resolved with some iron supplementation.   Second opinion is Thursday with Dr. Joan Flores.  She has a history of thyroid disease. She is on methimazole (which she previously took when her kids were young, around 30 years ago). They think that this might have to do with contracting COVID19 in January 2022.   Otherwise, there is no history of other atopic diseases, including asthma, food allergies, drug allergies, stinging insect allergies, eczema, urticaria, or contact dermatitis. There is no significant infectious history. Vaccinations are up to date.    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 If you have any questions, ask your nurse or doctor.          STOP taking these medications    atenolol 25 MG tablet Commonly known as: TENORMIN Stopped by: Valentina Shaggy, MD   pantoprazole 40 MG tablet Commonly known as: PROTONIX Stopped by: Valentina Shaggy, MD       TAKE these medications    cholecalciferol 1000 units tablet Commonly known as: VITAMIN D Take 1,000 Units by mouth daily.   chromagen capsule Take 2 capsules by mouth daily.   methimazole 5 MG tablet Commonly known as: TAPAZOLE Take 1 tablet (5 mg total) by mouth daily.   OMEGA-3 FISH OIL PO 1 tablespoon daily by mouth.        Birth History: non-contributory  Developmental History: non-contributory  Past Surgical History: Past Surgical History:  Procedure Laterality Date   GANGLION CYST EXCISION     rt finger   KNEE ARTHROSCOPY Left 11/06/2013   Procedure: ARTHROSCOPY KNEE, chondroplasty and medial menisectomy left knee;  Surgeon: Alta Corning, MD;  Location: [facility];  Service: Orthopedics;  Laterality: Left;   SHOULDER ARTHROSCOPY  2004   right   TUBAL LIGATION Bilateral      Family History: Family History  Problem Relation Age of Onset   Hypertension Mother    Atrial fibrillation Mother    Hypertension Father    Prostate cancer Father    Heart Problems Father        Pacemaker    Healthy Brother    Healthy Sister    Breast cancer Cousin      Social History: [name] lives at home with her family. She lives in a house that is 60 years old. There is carpeting throughout the home. There is gas heating and central cooling. There are no animals  inside or outside of the home. There are no dust mite coverings on the bedding. There is no tobacco exposure. She does not live near an interstate or industrial area.    Review of Systems  Constitutional: Negative.  Negative for fever, malaise/fatigue and weight loss.  HENT: Negative.  Negative for congestion, ear discharge and ear pain.   Eyes:  Negative for pain, discharge and redness.  Respiratory:  Negative for cough, sputum production, shortness of breath and wheezing.   Cardiovascular: Negative.  Negative for chest pain and palpitations.  Gastrointestinal:  Negative for abdominal pain, constipation, diarrhea, heartburn, nausea and vomiting.  Skin: Negative.  Negative for itching and rash.  Neurological:  Negative for dizziness and headaches.  Endo/Heme/Allergies:  Negative for environmental allergies. Does not bruise/bleed easily.      Objective:   Blood pressure 130/72, pulse (!) 59, temperature 98.1 F (36.7 C), temperature source Temporal, resp. rate 14, height 5'  4" (1.626 m), weight 174 lb 6.4 oz (79.1 kg), last menstrual period 10/05/2013, SpO2 98 %. Body mass index is 29.94 kg/m.   Physical Exam:   Physical Exam Constitutional:      Appearance: She is well-developed.  HENT:     Head: Normocephalic and atraumatic.     Right Ear: Tympanic membrane, ear canal and external ear normal. No drainage, swelling or tenderness. Tympanic membrane is not injected, scarred, erythematous, retracted or bulging.     Left Ear: Tympanic membrane, ear canal and external ear normal. No drainage, swelling or tenderness. Tympanic membrane is not injected, scarred, erythematous, retracted or bulging.     Nose: No nasal deformity, septal deviation, mucosal edema or rhinorrhea.     Right Sinus: No maxillary sinus tenderness or frontal sinus tenderness.     Left Sinus: No maxillary sinus tenderness or frontal sinus tenderness.     Mouth/Throat:     Mouth: Mucous membranes are not pale and not  dry.     Pharynx: Uvula midline.  Eyes:     General:        Right eye: No discharge.        Left eye: No discharge.     Conjunctiva/sclera: Conjunctivae normal.     Right eye: Right conjunctiva is not injected. No chemosis.    Left eye: Left conjunctiva is not injected. No chemosis.    Pupils: Pupils are equal, round, and reactive to light.  Cardiovascular:     Rate and Rhythm: Normal rate and regular rhythm.     Heart sounds: Normal heart sounds.  Pulmonary:     Effort: Pulmonary effort is normal. No tachypnea, accessory muscle usage or respiratory distress.     Breath sounds: Normal breath sounds. No wheezing, rhonchi or rales.  Chest:     Chest wall: No tenderness.  Abdominal:     Tenderness: There is no abdominal tenderness. There is no guarding or rebound.  Lymphadenopathy:     Head:     Right side of head: No submandibular, tonsillar or occipital adenopathy.     Left side of head: No submandibular, tonsillar or occipital adenopathy.     Cervical: No cervical adenopathy.  Skin:    Coloration: Skin is not pale.     Findings: No abrasion, erythema, petechiae or rash. Rash is not papular, urticarial or vesicular.  Neurological:     Mental Status: She is alert.     Diagnostic studies: none       Salvatore Marvel, MD [facility]

## 2021-06-24 ENCOUNTER — Encounter: Payer: Self-pay | Admitting: Allergy & Immunology

## 2021-07-02 ENCOUNTER — Encounter: Payer: Self-pay | Admitting: Internal Medicine

## 2021-07-11 NOTE — Progress Notes (Signed)
We received your outside records from [facility].  She has eosinophilic enteritis rather than eosinophilic esophagitis.  I do not have an exact number of eosinophils per high-power field, but she has already established care with another gastroenterology physician.  There is also be scanned in the system.  Salvatore Marvel, MD [facility]

## 2021-07-15 ENCOUNTER — Other Ambulatory Visit: Payer: Self-pay | Admitting: Family Medicine

## 2021-07-15 ENCOUNTER — Ambulatory Visit (INDEPENDENT_AMBULATORY_CARE_PROVIDER_SITE_OTHER): Payer: BC Managed Care – PPO | Admitting: Cardiology

## 2021-07-15 ENCOUNTER — Encounter: Payer: Self-pay | Admitting: Cardiology

## 2021-07-15 ENCOUNTER — Other Ambulatory Visit: Payer: Self-pay

## 2021-07-15 VITALS — BP 108/62 | HR 78 | Ht 64.0 in | Wt 175.6 lb

## 2021-07-15 DIAGNOSIS — Z87898 Personal history of other specified conditions: Secondary | ICD-10-CM

## 2021-07-15 DIAGNOSIS — I493 Ventricular premature depolarization: Secondary | ICD-10-CM

## 2021-07-15 DIAGNOSIS — Z1231 Encounter for screening mammogram for malignant neoplasm of breast: Secondary | ICD-10-CM

## 2021-07-15 DIAGNOSIS — I471 Supraventricular tachycardia: Secondary | ICD-10-CM

## 2021-07-15 DIAGNOSIS — E7800 Pure hypercholesterolemia, unspecified: Secondary | ICD-10-CM | POA: Diagnosis not present
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
## 2021-07-15 NOTE — Patient Instructions (Signed)
Medication Instructions:  Your physician recommends that you continue on your current medications as directed. Please refer to the Current Medication list given to you today.  *If you need a refill on your cardiac medications before your next appointment, please call your pharmacy*  Follow-Up: At [facility], you and your health needs are our priority.  As part of our continuing mission to provide you with exceptional heart care, we have created designated Provider Care Teams.  These Care Teams include your primary Cardiologist (physician) and Advanced Practice Providers (APPs -  Physician Assistants and Nurse Practitioners) who all work together to provide you with the care you need, when you need it.   Your next appointment:   9 month(s)  The format for your next appointment:   In Person  Provider:   Fransico Him, MD

## 2021-07-26 ENCOUNTER — Ambulatory Visit: Payer: BC Managed Care – PPO | Admitting: Internal Medicine

## 2021-07-27 ENCOUNTER — Other Ambulatory Visit: Payer: Self-pay

## 2021-07-27 ENCOUNTER — Ambulatory Visit: Payer: BC Managed Care – PPO | Admitting: Internal Medicine

## 2021-07-27 ENCOUNTER — Encounter: Payer: Self-pay | Admitting: Internal Medicine

## 2021-07-27 VITALS — BP 112/72 | HR 74 | Ht 64.0 in | Wt 174.0 lb

## 2021-07-27 DIAGNOSIS — E0500 Thyrotoxicosis with diffuse goiter without thyrotoxic crisis or storm: Secondary | ICD-10-CM | POA: Diagnosis not present

## 2021-07-27 DIAGNOSIS — R748 Abnormal levels of other serum enzymes: Secondary | ICD-10-CM | POA: Diagnosis not present

## 2021-07-27 DIAGNOSIS — E0590 Thyrotoxicosis, unspecified without thyrotoxic crisis or storm: Secondary | ICD-10-CM

## 2021-07-27 LAB — COMPREHENSIVE METABOLIC PANEL
ALT: 10 U/L (ref 0–35)
AST: 16 U/L (ref 0–37)
Albumin: 4.7 g/dL (ref 3.5–5.2)
Alkaline Phosphatase: 132 U/L — ABNORMAL HIGH (ref 39–117)
BUN: 12 mg/dL (ref 6–23)
CO2: 32 mEq/L (ref 19–32)
Calcium: 9.6 mg/dL (ref 8.4–10.5)
Chloride: 101 mEq/L (ref 96–112)
Creatinine, Ser: 0.9 mg/dL (ref 0.40–1.20)
GFR: 69.52 mL/min (ref >60.00)
Glucose, Bld: 95 mg/dL (ref 70–99)
Potassium: 4.1 mEq/L (ref 3.5–5.1)
Sodium: 139 mEq/L (ref 135–145)
Total Bilirubin: 0.8 mg/dL (ref 0.2–1.2)
Total Protein: 7.3 g/dL (ref 6.0–8.3)

## 2021-07-27 LAB — CBC WITH DIFFERENTIAL/PLATELET
Basophils Absolute: 0 10*3/uL (ref 0.0–0.1)
Basophils Relative: 0.8 % (ref 0.0–3.0)
Eosinophils Absolute: 0 10*3/uL (ref 0.0–0.7)
Eosinophils Relative: 0.6 % (ref 0.0–5.0)
HCT: 37.8 % (ref 36.0–46.0)
Hemoglobin: 12.2 g/dL (ref 12.0–15.0)
Lymphocytes Relative: 41.9 % (ref 12.0–46.0)
Lymphs Abs: 1.7 10*3/uL (ref 0.7–4.0)
MCHC: 32.3 g/dL (ref 30.0–36.0)
MCV: 85.4 fl (ref 78.0–100.0)
Monocytes Absolute: 0.4 10*3/uL (ref 0.1–1.0)
Monocytes Relative: 8.9 % (ref 3.0–12.0)
Neutro Abs: 1.9 10*3/uL (ref 1.4–7.7)
Neutrophils Relative %: 47.8 % (ref 43.0–77.0)
Platelets: 209 10*3/uL (ref 150.0–400.0)
RBC: 4.42 Mil/uL (ref 3.87–5.11)
RDW: 12.3 % (ref 11.5–15.5)
WBC: 4 10*3/uL (ref 4.0–10.5)

## 2021-07-27 LAB — T4, FREE: Free T4: 0.83 ng/dL (ref 0.60–1.60)

## 2021-07-27 LAB — TSH: TSH: 2.49 u[IU]/mL (ref 0.35–5.50)

## 2021-07-27 MED ORDER — METHIMAZOLE 5 MG PO TABS: 5.0000 mg | ORAL_TABLET | Freq: Every day | ORAL | 2 refills | Status: DC

## 2021-07-27 NOTE — Patient Instructions (Addendum)
-   Please have blood work today and again in 3 months
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
## 2021-08-11 ENCOUNTER — Ambulatory Visit: Payer: BC Managed Care – PPO | Admitting: Internal Medicine

## 2021-08-17 ENCOUNTER — Other Ambulatory Visit: Payer: Self-pay

## 2021-08-17 ENCOUNTER — Ambulatory Visit
Admission: RE | Admit: 2021-08-17 | Discharge: 2021-08-17 | Disposition: A | Discharge status: Home / Self Care | Payer: BC Managed Care – PPO | Source: Ambulatory Visit | Attending: Family Medicine | Admitting: Family Medicine

## 2021-08-17 DIAGNOSIS — Z1231 Encounter for screening mammogram for malignant neoplasm of breast: Secondary | ICD-10-CM

## 2021-09-10 ENCOUNTER — Encounter: Payer: Self-pay | Admitting: Gastroenterology

## 2021-09-14 ENCOUNTER — Telehealth: Payer: Self-pay | Admitting: Family Medicine

## 2021-09-14 NOTE — Telephone Encounter (Signed)
Patient states she paid her copay with a check back on 11/22 when she came to our office to see Dr. Kelton Pillar. Patient states she has not yet been charged for it. The amount was of 94 dollars. Please advice.

## 2021-09-20 NOTE — Telephone Encounter (Signed)
I have emailed the manager of patient accounting asking her or someone on her team to please take a look at this for the patient and get back with the patient regarding this question about her payment as we did receive the patient's payment on DOS 07/27/21 in the form of a check and it was posted to the patient's account.  I tried to call the patient to leave her a voice mail to update her on the status of this and her mailbox was full.  I could not leave a message.

## 2021-10-20 ENCOUNTER — Telehealth: Payer: Self-pay | Admitting: Cardiology

## 2021-10-20 NOTE — Telephone Encounter (Signed)
Patient would like to switch from Dr. Radford Pax to Dr. Johney Frame.  Are you both in agreement with this?

## 2021-10-21 NOTE — Telephone Encounter (Signed)
Okay with me 

## 2021-11-10 ENCOUNTER — Other Ambulatory Visit: Payer: Self-pay | Admitting: Family Medicine

## 2021-11-10 DIAGNOSIS — M810 Age-related osteoporosis without current pathological fracture: Secondary | ICD-10-CM

## 2021-11-10 DIAGNOSIS — E2839 Other primary ovarian failure: Secondary | ICD-10-CM

## 2021-12-15 ENCOUNTER — Other Ambulatory Visit: Payer: BC Managed Care – PPO

## 2021-12-29 ENCOUNTER — Telehealth: Payer: Self-pay | Admitting: Orthopaedic Surgery

## 2021-12-29 NOTE — Telephone Encounter (Signed)
Pt is calling with Lower back pain,  wants to know if something can be called in until she sees the dr  ?

## 2021-12-30 ENCOUNTER — Other Ambulatory Visit: Payer: Self-pay | Admitting: Physician Assistant

## 2021-12-30 MED ORDER — TRAMADOL HCL 50 MG PO TABS: 50.0000 mg | ORAL_TABLET | Freq: Three times a day (TID) | ORAL | 0 refills | Status: DC | PRN

## 2021-12-30 NOTE — Telephone Encounter (Signed)
Called patient.  No answer.

## 2021-12-30 NOTE — Telephone Encounter (Signed)
Sent in tramadol

## 2022-01-04 ENCOUNTER — Telehealth: Payer: Self-pay | Admitting: Orthopaedic Surgery

## 2022-01-04 NOTE — Telephone Encounter (Signed)
Patient called asked if she can she get some lab work done to see if she has a kidney infection when she come to her appointment? Patient has an appointment on 01/07/2022. The number to contact patient is [phone] ?

## 2022-01-05 NOTE — Telephone Encounter (Signed)
We are not able to evaluate her for a kidney infection.  She should see her PCP about that.  Thanks.

## 2022-01-05 NOTE — Telephone Encounter (Signed)
Patient aware.

## 2022-01-07 ENCOUNTER — Ambulatory Visit (INDEPENDENT_AMBULATORY_CARE_PROVIDER_SITE_OTHER): Payer: BC Managed Care – PPO

## 2022-01-07 ENCOUNTER — Encounter: Payer: Self-pay | Admitting: Orthopaedic Surgery

## 2022-01-07 ENCOUNTER — Ambulatory Visit (INDEPENDENT_AMBULATORY_CARE_PROVIDER_SITE_OTHER): Payer: BC Managed Care – PPO | Admitting: Orthopaedic Surgery

## 2022-01-07 DIAGNOSIS — M545 Low back pain, unspecified: Secondary | ICD-10-CM

## 2022-01-07 MED ORDER — DICLOFENAC SODIUM 75 MG PO TBEC: 75.0000 mg | DELAYED_RELEASE_TABLET | Freq: Two times a day (BID) | ORAL | 2 refills | Status: DC | PRN

## 2022-01-07 MED ORDER — ACETAMINOPHEN-CODEINE #3 300-30 MG PO TABS: 1.0000 | ORAL_TABLET | Freq: Three times a day (TID) | ORAL | 0 refills | Status: DC | PRN
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
## 2022-01-12 DIAGNOSIS — H9011 Conductive hearing loss, unilateral, right ear, with unrestricted hearing on the contralateral side: Secondary | ICD-10-CM | POA: Insufficient documentation

## 2022-01-12 DIAGNOSIS — Z860101 Personal history of adenomatous and serrated colon polyps: Secondary | ICD-10-CM | POA: Insufficient documentation

## 2022-01-12 DIAGNOSIS — K2940 Chronic atrophic gastritis without bleeding: Secondary | ICD-10-CM | POA: Insufficient documentation

## 2022-01-12 DIAGNOSIS — K2980 Duodenitis without bleeding: Secondary | ICD-10-CM | POA: Insufficient documentation

## 2022-01-12 DIAGNOSIS — K5281 Eosinophilic gastritis or gastroenteritis: Secondary | ICD-10-CM | POA: Insufficient documentation
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
## 2022-01-17 ENCOUNTER — Other Ambulatory Visit: Payer: Self-pay

## 2022-01-17 ENCOUNTER — Ambulatory Visit (INDEPENDENT_AMBULATORY_CARE_PROVIDER_SITE_OTHER): Payer: BC Managed Care – PPO | Admitting: Rehabilitative and Restorative Service Providers"

## 2022-01-17 ENCOUNTER — Ambulatory Visit: Payer: BC Managed Care – PPO | Admitting: Rehabilitative and Restorative Service Providers"

## 2022-01-17 ENCOUNTER — Encounter: Payer: Self-pay | Admitting: Rehabilitative and Restorative Service Providers"

## 2022-01-17 DIAGNOSIS — M5459 Other low back pain: Secondary | ICD-10-CM
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
## 2022-03-16 IMAGING — US US THYROID
1 series · 14 of 25 positions shown · non-contrast
Comparison: None.

CLINICAL DATA: Hyperthyroid.

EXAM:
THYROID ULTRASOUND
TECHNIQUE: Ultrasound examination of the thyroid gland and adjacent soft
tissues was performed.

[Series 1: us thyroid · 0.08mm/px · 14 of 34 slices shown]
[im 1/34]
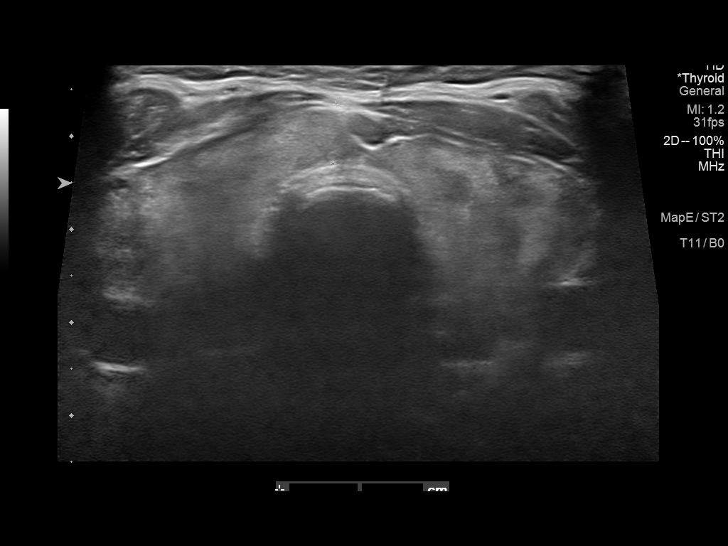
[im 3/34]
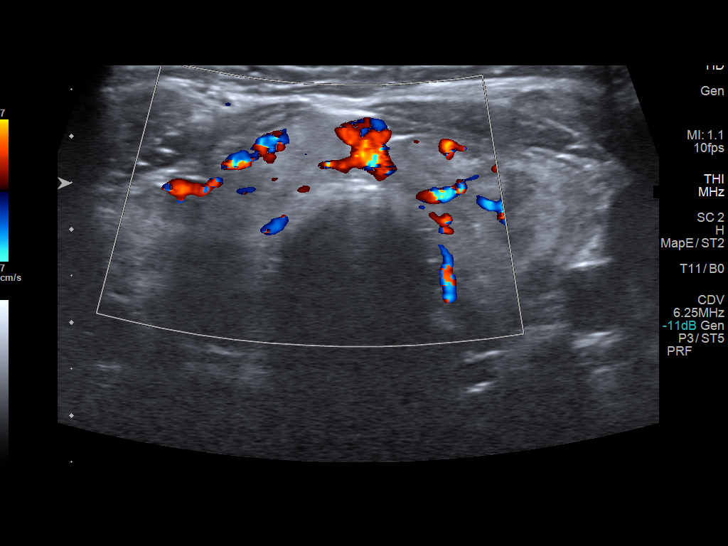
[im 6/34]
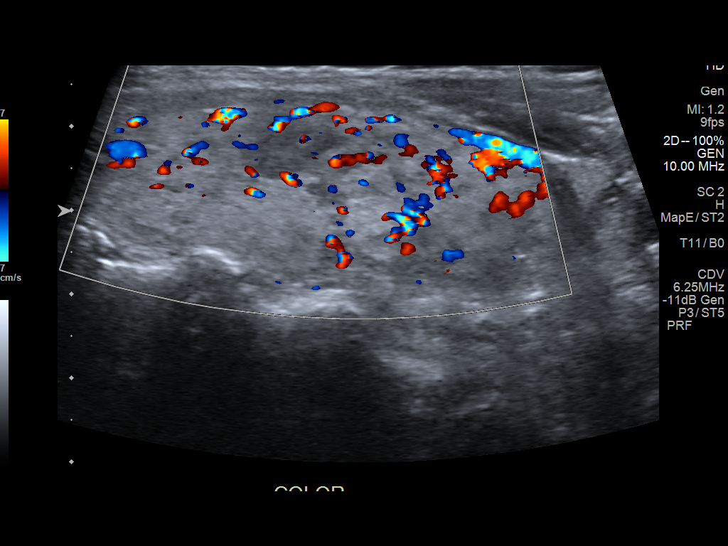
[im 9/34]
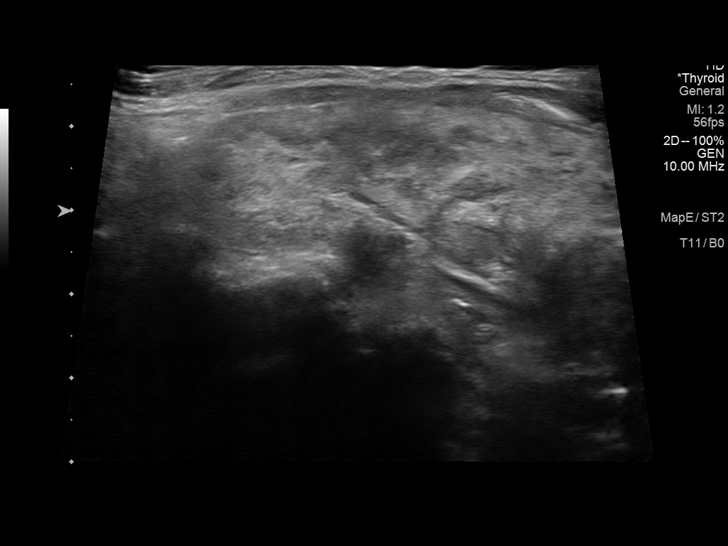
[im 12/34]
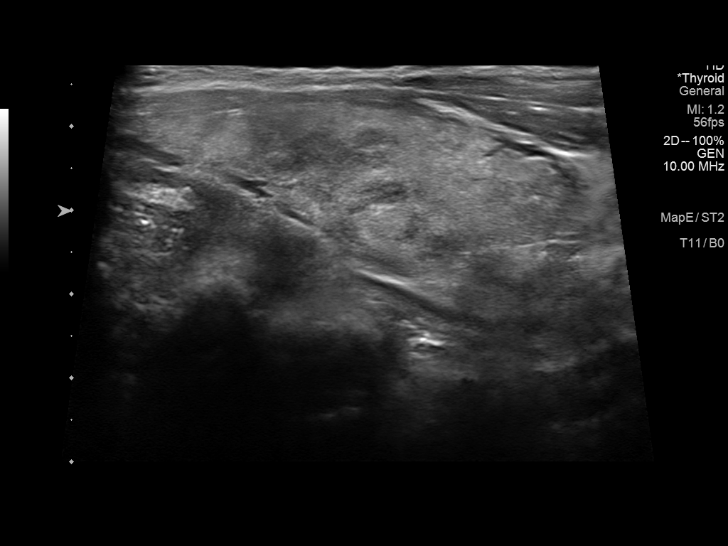
[im 13/34]
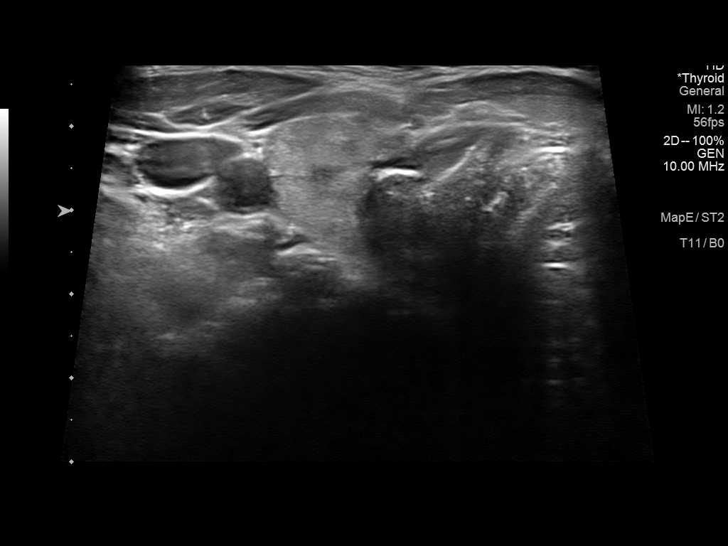
[im 16/34]
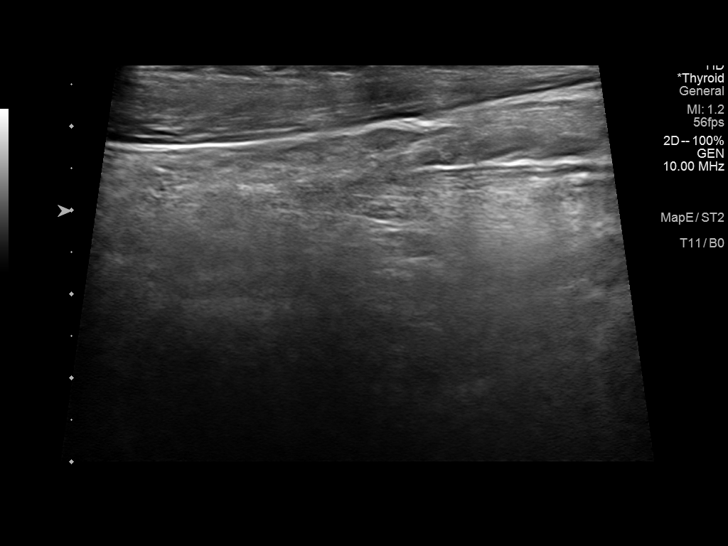
[im 18/34]
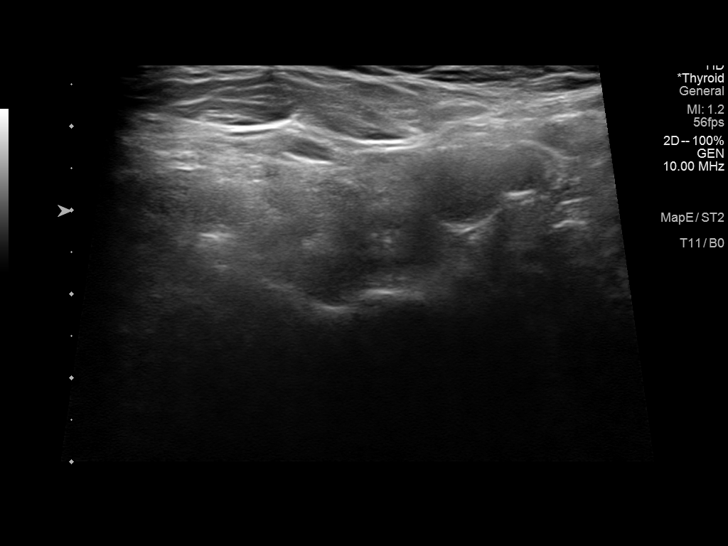
[im 21/34]
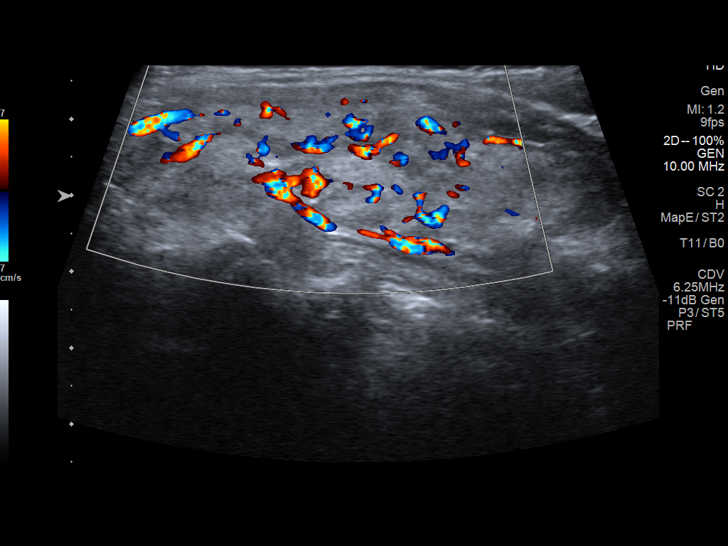
[im 23/34]
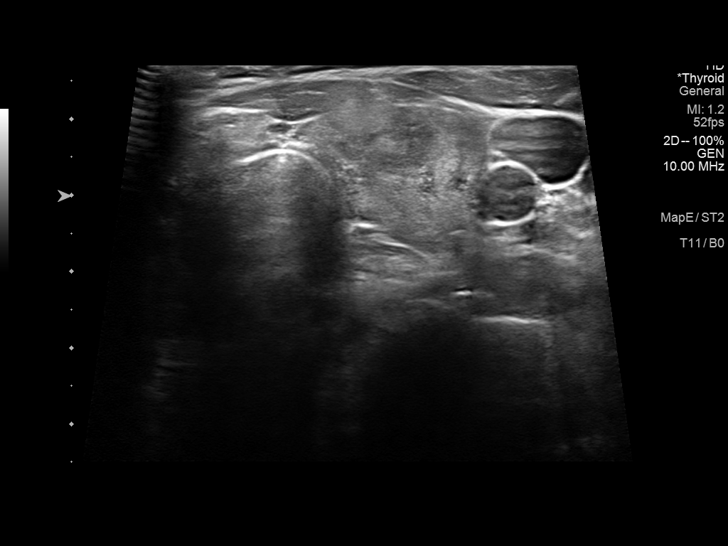
[im 25/34]
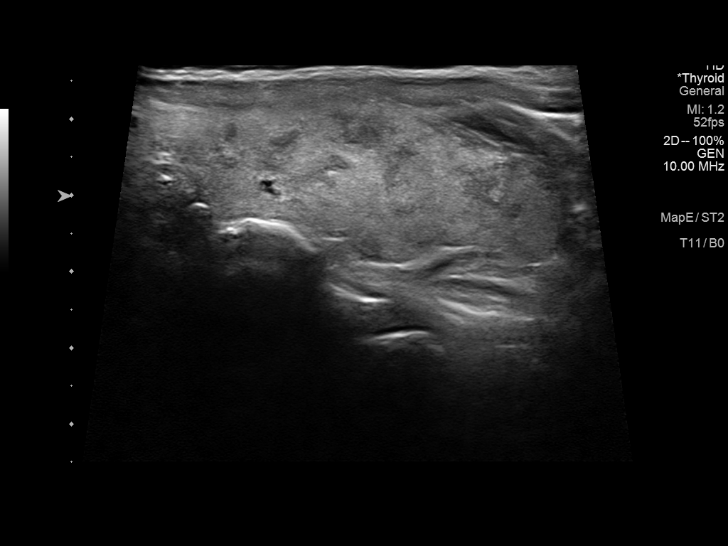
[im 28/34]
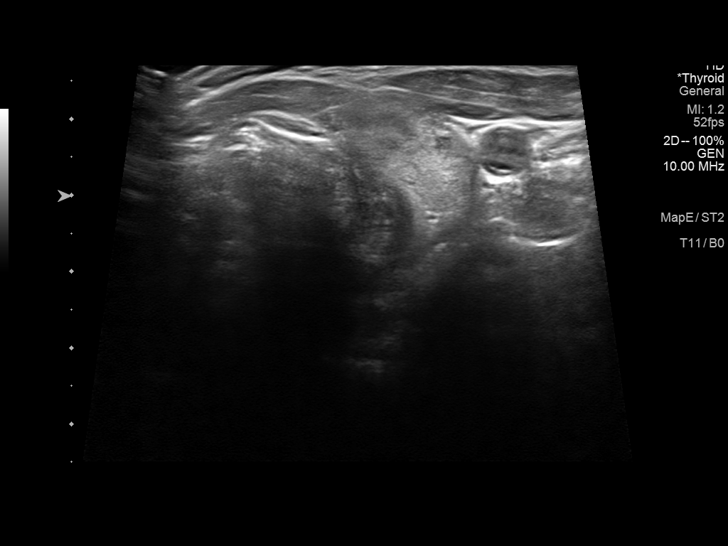
[im 31/34]
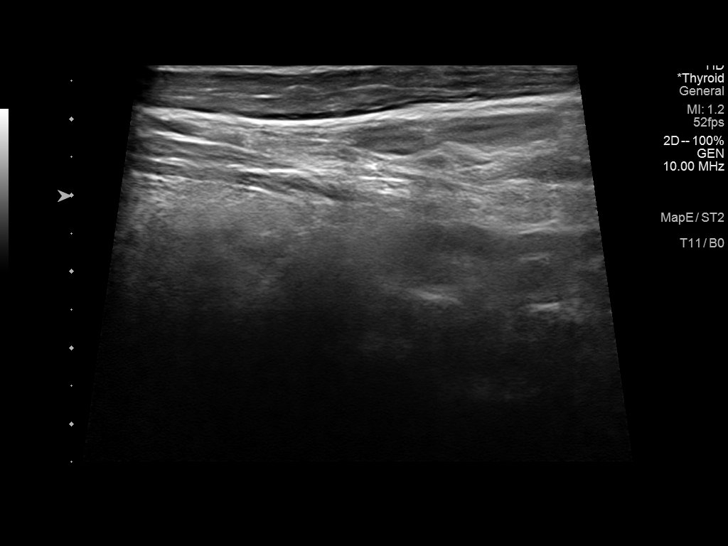
[im 34/34]
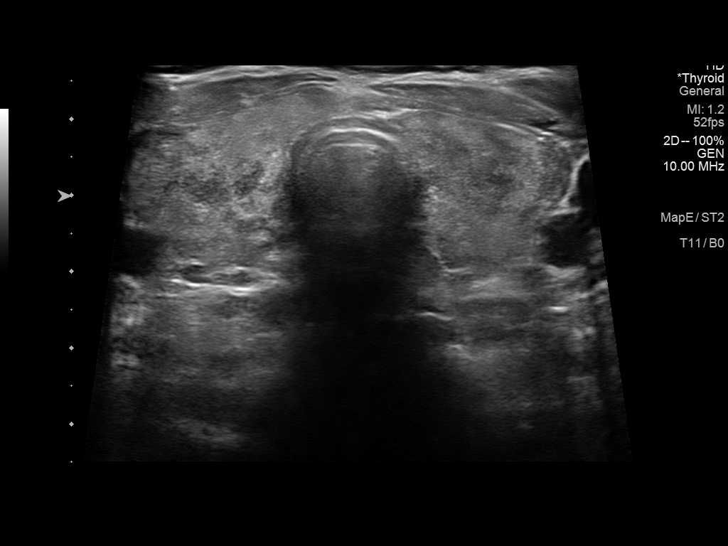

[14 of 25 positions shown; findings below may reference images not displayed]

FINDINGS: Parenchymal Echotexture: Markedly heterogenous - suspected mild
diffuse glandular hyperemia (images 4, 7 and 22)

Isthmus: Normal in size measures 0.6 cm in diameter

Right lobe: Enlarged measuring 6.4 x 2.2 x 2.4 cm

Left lobe: Enlarged measuring 6.3 x 2.0 x 2.1 cm

_________________________________________________________

Estimated total number of nodules >/= 1 cm: 0

Number of spongiform nodules >/=  2 cm not described below (TR1): 0

Number of mixed cystic and solid nodules >/= 1.5 cm not described
below (TR2): 0

_________________________________________________________

No discrete nodules are seen within the thyroid gland.
IMPRESSION: Enlarged, markedly heterogeneous and potentially hyperemic thyroid
without discrete nodule or mass, nonspecific though worrisome for an
acute thyroiditis. Clinical correlation is advised.
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
## 2022-04-18 ENCOUNTER — Encounter: Payer: Self-pay | Admitting: Cardiology

## 2022-04-18 ENCOUNTER — Other Ambulatory Visit: Payer: BC Managed Care – PPO

## 2022-04-18 ENCOUNTER — Ambulatory Visit (INDEPENDENT_AMBULATORY_CARE_PROVIDER_SITE_OTHER): Payer: BC Managed Care – PPO | Admitting: Cardiology

## 2022-04-18 VITALS — BP 110/82 | HR 68 | Wt 178.2 lb

## 2022-04-18 DIAGNOSIS — E7800 Pure hypercholesterolemia, unspecified: Secondary | ICD-10-CM | POA: Diagnosis not present

## 2022-04-18 DIAGNOSIS — I493 Ventricular premature depolarization: Secondary | ICD-10-CM

## 2022-04-18 DIAGNOSIS — R011 Cardiac murmur, unspecified: Secondary | ICD-10-CM

## 2022-04-18 DIAGNOSIS — Z79899 Other long term (current) drug therapy: Secondary | ICD-10-CM

## 2022-04-18 DIAGNOSIS — Z87898 Personal history of other specified conditions: Secondary | ICD-10-CM | POA: Diagnosis not present

## 2022-04-18 DIAGNOSIS — I471 Supraventricular tachycardia: Secondary | ICD-10-CM

## 2022-04-18 NOTE — Patient Instructions (Signed)
Medication Instructions:   Your physician recommends that you continue on your current medications as directed. Please refer to the Current Medication list given to you today.  *If you need a refill on your cardiac medications before your next appointment, please call your pharmacy*   Lab Work:  SOMETIME THIS WEEK--CMET, CBC W DIFF, HEMOGLOBIN A1C, VITAMIN D LEVEL, AND LIPIDS--PLEASE COME FASTING TO THIS LAB APPOINTMENT  If you have labs (blood work) drawn today and your tests are completely normal, you will receive your results only by: [place] (if you have MyChart) OR A paper copy in the mail If you have any lab test that is abnormal or we need to change your treatment, we will call you to review the results.   Testing/Procedures:  Your physician has requested that you have an echocardiogram. Echocardiography is a painless test that uses sound waves to create images of your heart. It provides your doctor with information about the size and shape of your heart and how well your heart's chambers and valves are working. This procedure takes approximately one hour. There are no restrictions for this procedure.    Follow-Up: At [facility], you and your health needs are our priority.  As part of our continuing mission to provide you with exceptional heart care, we have created designated Provider Care Teams.  These Care Teams include your primary Cardiologist (physician) and Advanced Practice Providers (APPs -  Physician Assistants and Nurse Practitioners) who all work together to provide you with the care you need, when you need it.  We recommend signing up for the patient portal called "MyChart".  Sign up information is provided on this After Visit Summary.  MyChart is used to connect with patients for Virtual Visits (Telemedicine).  Patients are able to view lab/test results, encounter notes, upcoming appointments, etc.  Non-urgent messages can be sent to your provider as well.    To learn more about what you can do with MyChart, go to [email or web address].    Your next appointment:   1 year(s)  The format for your next appointment:   In Person  Provider:   DR. Johney Frame   Important Information About Sugar

## 2022-04-19 ENCOUNTER — Telehealth: Payer: Self-pay | Admitting: Cardiology

## 2022-04-19 NOTE — Telephone Encounter (Signed)
Pt is calling to verify we are checking her electrolytes when she comes into her scheduled lab appt on 8/17.  Pt aware that we are checking her electrolytes, kidney function, A1C, Vit D level, CBC W DIFF, and Lipids on 8/17.  She is aware to come fasting.  Pt verbalized understanding and agrees with this plan. Pt was more than gracious for all the assistance provided.

## 2022-04-19 NOTE — Telephone Encounter (Signed)
Patient is calling to talk with Dr. Johney Frame or nurse.

## 2022-04-21 ENCOUNTER — Other Ambulatory Visit: Payer: BC Managed Care – PPO

## 2022-04-21 ENCOUNTER — Ambulatory Visit (HOSPITAL_COMMUNITY): Payer: BC Managed Care – PPO | Attending: Cardiology

## 2022-04-21 DIAGNOSIS — Z79899 Other long term (current) drug therapy: Secondary | ICD-10-CM

## 2022-04-21 DIAGNOSIS — I471 Supraventricular tachycardia: Secondary | ICD-10-CM

## 2022-04-21 DIAGNOSIS — I493 Ventricular premature depolarization: Secondary | ICD-10-CM

## 2022-04-21 DIAGNOSIS — R011 Cardiac murmur, unspecified: Secondary | ICD-10-CM

## 2022-04-21 DIAGNOSIS — E7800 Pure hypercholesterolemia, unspecified: Secondary | ICD-10-CM

## 2022-04-21 DIAGNOSIS — Z87898 Personal history of other specified conditions: Secondary | ICD-10-CM

## 2022-04-21 LAB — ECHOCARDIOGRAM COMPLETE
Area-P 1/2: 2.97 cm2
S' Lateral: 2.1 cm

## 2022-04-22 LAB — CBC WITH DIFFERENTIAL/PLATELET
Basophils Absolute: 0 10*3/uL (ref 0.0–0.2)
Basos: 1 %
EOS (ABSOLUTE): 0 10*3/uL (ref 0.0–0.4)
Eos: 1 %
Hematocrit: 36.3 % (ref 34.0–46.6)
Hemoglobin: 11.9 g/dL (ref 11.1–15.9)
Immature Grans (Abs): 0 10*3/uL (ref 0.0–0.1)
Immature Granulocytes: 0 %
Lymphocytes Absolute: 1.7 10*3/uL (ref 0.7–3.1)
Lymphs: 43 %
MCH: 27.7 pg (ref 26.6–33.0)
MCHC: 32.8 g/dL (ref 31.5–35.7)
MCV: 84 fL (ref 79–97)
Monocytes Absolute: 0.3 10*3/uL (ref 0.1–0.9)
Monocytes: 8 %
Neutrophils Absolute: 1.8 10*3/uL (ref 1.4–7.0)
Neutrophils: 47 %
Platelets: 217 10*3/uL (ref 150–450)
RBC: 4.3 x10E6/uL (ref 3.77–5.28)
RDW: 12.3 % (ref 11.7–15.4)
WBC: 3.9 10*3/uL (ref 3.4–10.8)

## 2022-04-22 LAB — LIPID PANEL
Chol/HDL Ratio: 2.4 ratio (ref 0.0–4.4)
Cholesterol, Total: 218 mg/dL — ABNORMAL HIGH (ref 100–199)
HDL: 92 mg/dL (ref >39)
LDL Chol Calc (NIH): 112 mg/dL — ABNORMAL HIGH (ref 0–99)
Triglycerides: 80 mg/dL (ref 0–149)
VLDL Cholesterol Cal: 14 mg/dL (ref 5–40)

## 2022-04-22 LAB — COMPREHENSIVE METABOLIC PANEL
ALT: 10 IU/L (ref 0–32)
AST: 17 IU/L (ref 0–40)
Albumin/Globulin Ratio: 2.5 — ABNORMAL HIGH (ref 1.2–2.2)
Albumin: 4.8 g/dL (ref 3.9–4.9)
Alkaline Phosphatase: 99 IU/L (ref 44–121)
BUN/Creatinine Ratio: 13 (ref 12–28)
BUN: 11 mg/dL (ref 8–27)
Bilirubin Total: 0.6 mg/dL (ref 0.0–1.2)
CO2: 27 mmol/L (ref 20–29)
Calcium: 9.4 mg/dL (ref 8.7–10.3)
Chloride: 102 mmol/L (ref 96–106)
Creatinine, Ser: 0.85 mg/dL (ref 0.57–1.00)
Globulin, Total: 1.9 g/dL (ref 1.5–4.5)
Glucose: 93 mg/dL (ref 70–99)
Potassium: 4.3 mmol/L (ref 3.5–5.2)
Sodium: 141 mmol/L (ref 134–144)
Total Protein: 6.7 g/dL (ref 6.0–8.5)
eGFR: 78 mL/min/{1.73_m2} (ref >59)

## 2022-04-22 LAB — HEMOGLOBIN A1C
Est. average glucose Bld gHb Est-mCnc: 108 mg/dL
Hgb A1c MFr Bld: 5.4 % (ref 4.8–5.6)

## 2022-04-22 LAB — VITAMIN D 25 HYDROXY (VIT D DEFICIENCY, FRACTURES): Vit D, 25-Hydroxy: 38.7 ng/mL (ref 30.0–100.0)

## 2022-05-14 ENCOUNTER — Encounter: Payer: Self-pay | Admitting: Cardiology

## 2022-05-17 ENCOUNTER — Ambulatory Visit
Admission: RE | Admit: 2022-05-17 | Discharge: 2022-05-17 | Disposition: A | Discharge status: Home / Self Care | Payer: BC Managed Care – PPO | Source: Ambulatory Visit | Attending: Family Medicine | Admitting: Family Medicine

## 2022-05-17 DIAGNOSIS — E2839 Other primary ovarian failure: Secondary | ICD-10-CM

## 2022-05-17 DIAGNOSIS — M810 Age-related osteoporosis without current pathological fracture: Secondary | ICD-10-CM

## 2022-07-22 ENCOUNTER — Encounter: Payer: Self-pay | Admitting: Internal Medicine

## 2022-08-08 ENCOUNTER — Other Ambulatory Visit: Payer: Self-pay | Admitting: Family Medicine

## 2022-08-08 DIAGNOSIS — Z1231 Encounter for screening mammogram for malignant neoplasm of breast: Secondary | ICD-10-CM

## 2022-09-09 ENCOUNTER — Ambulatory Visit: Payer: BC Managed Care – PPO

## 2022-10-10 ENCOUNTER — Encounter: Payer: Self-pay | Admitting: Cardiology

## 2022-10-10 DIAGNOSIS — E7800 Pure hypercholesterolemia, unspecified: Secondary | ICD-10-CM

## 2022-10-17 ENCOUNTER — Ambulatory Visit
Admission: RE | Admit: 2022-10-17 | Discharge: 2022-10-17 | Disposition: A | Discharge status: Home / Self Care | Payer: BC Managed Care – PPO | Source: Ambulatory Visit | Attending: Family Medicine | Admitting: Family Medicine

## 2022-10-17 DIAGNOSIS — Z1231 Encounter for screening mammogram for malignant neoplasm of breast: Secondary | ICD-10-CM

## 2022-10-21 NOTE — Telephone Encounter (Signed)
Order for Cardiac Calcium Score test placed and will send [facility] Scheduling dept a message to call the pt back to arrange this appt.  Pt aware of this.

## 2022-11-04 ENCOUNTER — Ambulatory Visit (HOSPITAL_COMMUNITY)
Admission: RE | Admit: 2022-11-04 | Discharge: 2022-11-04 | Disposition: A | Discharge status: Home / Self Care | Payer: BC Managed Care – PPO | Source: Ambulatory Visit | Attending: Cardiology | Admitting: Cardiology

## 2022-11-04 DIAGNOSIS — E7800 Pure hypercholesterolemia, unspecified: Secondary | ICD-10-CM

## 2023-01-06 ENCOUNTER — Ambulatory Visit: Payer: BC Managed Care – PPO | Admitting: Orthopaedic Surgery

## 2023-01-17 ENCOUNTER — Ambulatory Visit: Payer: BC Managed Care – PPO | Admitting: Orthopaedic Surgery

## 2023-01-24 ENCOUNTER — Other Ambulatory Visit (INDEPENDENT_AMBULATORY_CARE_PROVIDER_SITE_OTHER): Payer: BC Managed Care – PPO

## 2023-01-24 ENCOUNTER — Ambulatory Visit: Payer: BC Managed Care – PPO | Admitting: Orthopaedic Surgery

## 2023-01-24 DIAGNOSIS — G8929 Other chronic pain: Secondary | ICD-10-CM

## 2023-01-24 DIAGNOSIS — M25562 Pain in left knee: Secondary | ICD-10-CM | POA: Diagnosis not present

## 2023-01-24 DIAGNOSIS — M25561 Pain in right knee: Secondary | ICD-10-CM

## 2023-01-24 MED ORDER — DICLOFENAC SODIUM 75 MG PO TBEC: 75.0000 mg | DELAYED_RELEASE_TABLET | Freq: Two times a day (BID) | ORAL | 2 refills | Status: DC
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
## 2023-02-06 ENCOUNTER — Encounter: Payer: Self-pay | Admitting: Cardiology

## 2023-04-05 ENCOUNTER — Ambulatory Visit: Payer: BC Managed Care – PPO | Admitting: Cardiology

## 2023-04-17 ENCOUNTER — Telehealth: Payer: Self-pay | Admitting: Cardiology

## 2023-04-17 NOTE — Telephone Encounter (Signed)
Spoke with Yvette Stanton and advised Dr Anne Fu may check lipid profile but other labs have been completed fairly recently.  Yvette Stanton will come fasting incase she needs lab work.

## 2023-04-17 NOTE — Telephone Encounter (Signed)
Patient has appt tomorrow with Dr. Anne Fu. She wants to know if she needs to have labs prior and if she needs to fast. No orders are placed as of yet.

## 2023-04-18 ENCOUNTER — Encounter: Payer: Self-pay | Admitting: Cardiology

## 2023-04-18 ENCOUNTER — Ambulatory Visit: Payer: BC Managed Care – PPO | Attending: Cardiology | Admitting: Cardiology

## 2023-04-18 VITALS — BP 120/72 | HR 63 | Ht 64.0 in | Wt 178.8 lb

## 2023-04-18 DIAGNOSIS — I471 Supraventricular tachycardia, unspecified: Secondary | ICD-10-CM | POA: Diagnosis not present

## 2023-04-18 DIAGNOSIS — R011 Cardiac murmur, unspecified: Secondary | ICD-10-CM

## 2023-04-18 DIAGNOSIS — I493 Ventricular premature depolarization: Secondary | ICD-10-CM | POA: Diagnosis not present

## 2023-04-18 NOTE — Progress Notes (Signed)
Cardiology Office Note:  .   Date:  04/18/2023  ID:  Yvette Stanton, DOB [birth year], MRN [id number] PCP: Laurann Montana, MD  [facility] Providers Cardiologist:  Armanda Magic, MD    History of Present Illness: Marland Kitchen   Yvette Stanton is a 62 y.o. female Discussed the use of AI scribe software for clinical note transcription with the patient, who gave verbal consent to proceed.  History of Present Illness   The patient, with a history of PSVT, hyperlipidemia, iron deficiency anemia, and hyperthyroidism, presents for a routine follow-up visit. The patient reports feeling well and denies any chest pain, shortness of breath, or fainting episodes. The patient's PSVT has been asymptomatic, with only rare episodes of fast beats noted on previous monitoring. The patient's hyperlipidemia is being managed with diet and exercise, with a mildly elevated LDL cholesterol level of 112 last year. However, a coronary calcium score performed in 2019 was zero, indicating low risk. The patient's iron deficiency anemia has improved, with a normal hemoglobin level of 11.9 last year. The patient is taking iron supplements and had a normal endoscopy. The patient's hyperthyroidism is being managed by an endocrinologist, and the patient has been successfully weaned off methimazole. The patient also takes turmeric daily for its anti-inflammatory properties.         Studies Reviewed: Marland Kitchen   EKG Interpretation Date/Time:  Tuesday April 18 2023 08:48:30 EDT Ventricular Rate:  63 PR Interval:  176 QRS Duration:  76 QT Interval:  434 QTC Calculation: 444 R Axis:   37  Text Interpretation: Normal sinus rhythm Normal ECG When compared with ECG of 11-Dec-2020 17:53, Vent. rate has decreased BY  43 BPM Confirmed by Donato Schultz ([id number]) on 04/18/2023 9:00:46 AM    LABS LDL: 112 mg/dL (9562) Creatinine: 1.30 mg/dL (8657) Potassium: 4.3 mmol/L (2023) ALT: 10 U/L (2023) Hemoglobin: 11.9 g/dL (8469)  RADIOLOGY Coronary  Calcium Score: Zero, low risk (2019)  DIAGNOSTIC Echocardiogram: EF 60%, normal valves (2013) Holter Monitor: Brief atrial tachycardia (2013) Holter Monitor: Brief atrial tachycardia (2016) Nuclear Stress Test: EF 70%, no ischemia (2016) EKG: Normal, sinus rhythm (04/18/2023) Echocardiogram: EF 60%, no valvular issues (04/2022) Risk Assessment/Calculations:            Physical Exam:   VS:  BP 120/72   Pulse 63   Ht 5\' 4"  (1.626 m)   Wt 178 lb 12.8 oz (81.1 kg)   LMP 10/05/2013   SpO2 96%   BMI 30.69 kg/m    Wt Readings from Last 3 Encounters:  04/18/23 178 lb 12.8 oz (81.1 kg)  04/18/22 178 lb 3.2 oz (80.8 kg)  07/27/21 174 lb (78.9 kg)    GEN: Well nourished, well developed in no acute distress NECK: No JVD; No carotid bruits CARDIAC: RRR, no murmurs, rubs, gallops RESPIRATORY:  Clear to auscultation without rales, wheezing or rhonchi  ABDOMEN: Soft, non-tender, non-distended EXTREMITIES:  No edema; No deformity   ASSESSMENT AND PLAN: .   Assessment and Plan    Paroxysmal Supraventricular Tachycardia (PSVT) Rare episodes of atrial tachycardia on previous monitors. Currently asymptomatic with normal EKG today. -Continue current management and monitor for any new symptoms.  Hyperlipidemia Mildly elevated LDL cholesterol. Coronary calcium score in 2019 was zero, indicating low risk. -Continue current management with diet and exercise. No need for additional medication at this time.  Hyperthyroidism Managed by endocrinologist. TSH levels have been good. Patient was previously on methimazole but has since been tapered off. -Continue current management with  endocrinologist.  Iron Deficiency Anemia History of low hemoglobin, but recent levels have been normal. Patient is on iron supplementation. -Continue current management with iron supplementation.  Cardiac Function Prior echocardiogram in 2023 showed good pump function and no valvular issues. Nuclear stress test in  2016 showed EF of 70% without any ischemia. -Continue current management and monitor for any new symptoms.  Follow-up in 1 year unless new symptoms arise.              Signed, Donato Schultz, MD

## 2023-04-18 NOTE — Patient Instructions (Signed)

## 2023-07-04 ENCOUNTER — Other Ambulatory Visit: Payer: Self-pay

## 2023-07-21 ENCOUNTER — Ambulatory Visit (HOSPITAL_BASED_OUTPATIENT_CLINIC_OR_DEPARTMENT_OTHER): Payer: BC Managed Care – PPO | Admitting: Student

## 2023-07-25 ENCOUNTER — Ambulatory Visit (INDEPENDENT_AMBULATORY_CARE_PROVIDER_SITE_OTHER): Payer: BC Managed Care – PPO | Admitting: Physician Assistant

## 2023-07-25 ENCOUNTER — Encounter: Payer: Self-pay | Admitting: Physician Assistant

## 2023-07-25 ENCOUNTER — Other Ambulatory Visit (INDEPENDENT_AMBULATORY_CARE_PROVIDER_SITE_OTHER): Payer: BC Managed Care – PPO

## 2023-07-25 DIAGNOSIS — M25562 Pain in left knee: Secondary | ICD-10-CM

## 2023-07-25 DIAGNOSIS — G8929 Other chronic pain: Secondary | ICD-10-CM | POA: Diagnosis not present

## 2023-07-25 MED ORDER — BUPIVACAINE HCL 0.25 % IJ SOLN
2.0000 mL | INTRAMUSCULAR | Status: AC | PRN
  Administered 2023-07-25: 2 mL via INTRA_ARTICULAR

## 2023-07-25 MED ORDER — METHYLPREDNISOLONE ACETATE 40 MG/ML IJ SUSP
40.0000 mg | INTRAMUSCULAR | Status: AC | PRN
  Administered 2023-07-25: 40 mg via INTRA_ARTICULAR

## 2023-07-25 MED ORDER — LIDOCAINE HCL 1 % IJ SOLN
2.0000 mL | INTRAMUSCULAR | Status: AC | PRN
  Administered 2023-07-25: 2 mL

## 2023-07-25 NOTE — Addendum Note (Signed)
Addended by: Mayra Reel on: 07/25/2023 04:37 PM   Modules accepted: Level of Service
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
## 2023-08-21 ENCOUNTER — Telehealth: Payer: Self-pay | Admitting: Cardiology

## 2023-08-21 NOTE — Telephone Encounter (Signed)
Patient called to check if she will need to have annual lab work done.

## 2023-08-21 NOTE — Telephone Encounter (Signed)
Pt was seen in August 2024 and is calling now to see if she needs any lab work.  Advised typically if Dr Anne Fu felt she needed labwork he would have ordered at the time of her appt.  Pt is requesting for him to review for need for lab and to be called back if any new orders.  She will be due for 1 yr f/u August 2025.   Will send to Dr Anne Fu for review.

## 2023-08-22 NOTE — Telephone Encounter (Signed)
Jake Bathe, MD  You6 hours ago (6:38 AM)   No need for labs. PCP can obtain.    Pt is aware of the above information.

## 2023-10-07 ENCOUNTER — Other Ambulatory Visit: Payer: Self-pay

## 2023-10-07 ENCOUNTER — Encounter: Payer: Self-pay | Admitting: Emergency Medicine

## 2023-10-07 ENCOUNTER — Ambulatory Visit
Admission: EM | Admit: 2023-10-07 | Discharge: 2023-10-07 | Disposition: A | Discharge status: Home / Self Care | Payer: 59 | Attending: Physician Assistant | Admitting: Physician Assistant

## 2023-10-07 DIAGNOSIS — J069 Acute upper respiratory infection, unspecified: Secondary | ICD-10-CM | POA: Insufficient documentation

## 2023-10-07 LAB — POCT INFLUENZA A/B
Influenza A, POC: NEGATIVE
Influenza B, POC: NEGATIVE

## 2023-10-07 MED ORDER — PROMETHAZINE-DM 6.25-15 MG/5ML PO SYRP: 5.0000 mL | ORAL_SOLUTION | Freq: Three times a day (TID) | ORAL | 0 refills | Status: DC | PRN

## 2023-10-07 MED ORDER — IPRATROPIUM BROMIDE 0.03 % NA SOLN: 1.0000 | Freq: Two times a day (BID) | NASAL | 0 refills | Status: DC
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
## 2023-10-07 NOTE — Discharge Instructions (Signed)
You tested negative for the flu.  We will contact you if you are positive for COVID.  I suspect you have a different virus.  Please continue the over-the-counter medications.  Use ipratropium nasal spray to help with your congestion.  Take Promethazine DM for cough.  This will make you sleepy so do not drive or drink alcohol taking it.  Make sure you rest and drink plenty of fluid.  If your symptoms are not improving within a week or if anything worsens and you have high fever, worsening cough, shortness of breath, chest pain, nausea, vomiting you need to be seen immediately.

## 2023-10-07 NOTE — ED Triage Notes (Signed)
Pt here for cough, nasal congestion and sneezing x 3 days; pt sts bilateral ear fullness

## 2023-10-08 LAB — SARS CORONAVIRUS 2 (TAT 6-24 HRS): SARS Coronavirus 2: NEGATIVE

## 2023-12-08 ENCOUNTER — Other Ambulatory Visit: Payer: Self-pay | Admitting: Family Medicine

## 2023-12-08 DIAGNOSIS — Z1231 Encounter for screening mammogram for malignant neoplasm of breast: Secondary | ICD-10-CM

## 2023-12-21 ENCOUNTER — Ambulatory Visit
Admission: RE | Admit: 2023-12-21 | Discharge: 2023-12-21 | Disposition: A | Discharge status: Home / Self Care | Source: Ambulatory Visit | Attending: Family Medicine | Admitting: Family Medicine

## 2023-12-21 DIAGNOSIS — Z1231 Encounter for screening mammogram for malignant neoplasm of breast: Secondary | ICD-10-CM

## 2024-01-09 ENCOUNTER — Telehealth: Payer: Self-pay | Admitting: Cardiology

## 2024-01-09 NOTE — Telephone Encounter (Signed)
 Patient would prefer to have a female provider. Would both of you approve the switch from Dr. Renna Cary to Dr. Avanell Bob?

## 2024-01-23 ENCOUNTER — Encounter: Payer: Self-pay | Admitting: Physician Assistant

## 2024-01-23 ENCOUNTER — Ambulatory Visit: Admitting: Physician Assistant

## 2024-01-23 ENCOUNTER — Other Ambulatory Visit (INDEPENDENT_AMBULATORY_CARE_PROVIDER_SITE_OTHER)

## 2024-01-23 DIAGNOSIS — M545 Low back pain, unspecified: Secondary | ICD-10-CM

## 2024-01-23 MED ORDER — METHOCARBAMOL 750 MG PO TABS: 750.0000 mg | ORAL_TABLET | Freq: Three times a day (TID) | ORAL | 0 refills | Status: DC | PRN

## 2024-01-23 MED ORDER — PREDNISONE 10 MG (21) PO TBPK: ORAL_TABLET | ORAL | 0 refills | Status: DC
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
## 2024-01-25 ENCOUNTER — Telehealth: Payer: Self-pay | Admitting: Physician Assistant

## 2024-01-25 ENCOUNTER — Other Ambulatory Visit: Payer: Self-pay | Admitting: Physician Assistant

## 2024-01-25 MED ORDER — HYDROCODONE-ACETAMINOPHEN 5-325 MG PO TABS: 1.0000 | ORAL_TABLET | Freq: Two times a day (BID) | ORAL | 0 refills | Status: DC | PRN

## 2024-01-25 NOTE — Telephone Encounter (Signed)
 Not a problem.  The medicine is for a week so may not have full effect yet.  I sent in norco as well.  She expressed concern that some of her pain could be coming from her kidneys so she was to also fu with pcp. May want to remind her if she has not already done so

## 2024-01-25 NOTE — Telephone Encounter (Signed)
 Spoke with patient and relayed information.

## 2024-01-25 NOTE — Telephone Encounter (Signed)
 Pt states she still in pain after taking the meds for two days and what's to know what other options are available. Pt also states she would like to only see the MD moving forward.

## 2024-01-26 ENCOUNTER — Telehealth: Payer: Self-pay | Admitting: Orthopaedic Surgery

## 2024-01-26 NOTE — Telephone Encounter (Signed)
heat

## 2024-01-26 NOTE — Telephone Encounter (Signed)
 Patient called and wanted to know if she needs to apply heat or cold to her back pain? CB#[phone]

## 2024-01-26 NOTE — Telephone Encounter (Signed)
 Relayed to patient.

## 2024-01-30 ENCOUNTER — Telehealth: Payer: Self-pay | Admitting: Orthopaedic Surgery

## 2024-01-30 ENCOUNTER — Other Ambulatory Visit: Payer: Self-pay | Admitting: Physician Assistant

## 2024-01-30 NOTE — Telephone Encounter (Signed)
 Patient called and said she needs a refill on all medication. Her pain is severe. She also wants you to call her. Has questions. CB#[phone]

## 2024-01-31 ENCOUNTER — Other Ambulatory Visit: Payer: Self-pay

## 2024-01-31 ENCOUNTER — Telehealth: Payer: Self-pay | Admitting: Orthopaedic Surgery

## 2024-01-31 DIAGNOSIS — M545 Low back pain, unspecified: Secondary | ICD-10-CM

## 2024-01-31 NOTE — Telephone Encounter (Signed)
 Can you see if Jacqlyn Matas can see this patient for further evaluation?  Can we also put in order for mri lumbar spine?

## 2024-01-31 NOTE — Telephone Encounter (Signed)
MRI order made.   

## 2024-01-31 NOTE — Telephone Encounter (Signed)
 Done

## 2024-02-05 ENCOUNTER — Other Ambulatory Visit: Payer: Self-pay | Admitting: Orthopaedic Surgery

## 2024-02-07 ENCOUNTER — Ambulatory Visit
Admission: RE | Admit: 2024-02-07 | Discharge: 2024-02-07 | Disposition: A | Discharge status: Home / Self Care | Source: Ambulatory Visit | Attending: Orthopaedic Surgery | Admitting: Orthopaedic Surgery

## 2024-02-07 DIAGNOSIS — M545 Low back pain, unspecified: Secondary | ICD-10-CM

## 2024-02-08 ENCOUNTER — Telehealth: Payer: Self-pay | Admitting: Orthopaedic Surgery

## 2024-02-08 NOTE — Telephone Encounter (Signed)
 Patient called and wanted to know if she can walk to do exercise. CB#[phone]

## 2024-02-09 NOTE — Telephone Encounter (Signed)
 Yes that's fine

## 2024-02-09 NOTE — Telephone Encounter (Signed)
Called and relayed to patient. °

## 2024-02-14 ENCOUNTER — Ambulatory Visit: Admitting: Orthopaedic Surgery

## 2024-02-14 DIAGNOSIS — M545 Low back pain, unspecified: Secondary | ICD-10-CM | POA: Diagnosis not present

## 2024-02-14 MED ORDER — NAPROXEN 500 MG PO TABS: 500.0000 mg | ORAL_TABLET | Freq: Two times a day (BID) | ORAL | 3 refills | Status: DC
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
## 2024-02-15 ENCOUNTER — Other Ambulatory Visit

## 2024-02-20 ENCOUNTER — Ambulatory Visit: Admitting: Orthopaedic Surgery

## 2024-04-19 DIAGNOSIS — K0510 Chronic gingivitis, plaque induced: Secondary | ICD-10-CM | POA: Insufficient documentation
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
## 2024-05-02 ENCOUNTER — Ambulatory Visit: Attending: Cardiology | Admitting: Internal Medicine

## 2024-05-02 ENCOUNTER — Encounter: Payer: Self-pay | Admitting: Internal Medicine

## 2024-05-02 VITALS — BP 126/80 | HR 62 | Ht 63.0 in | Wt 177.6 lb

## 2024-05-02 DIAGNOSIS — I471 Supraventricular tachycardia, unspecified: Secondary | ICD-10-CM

## 2024-05-02 DIAGNOSIS — E7800 Pure hypercholesterolemia, unspecified: Secondary | ICD-10-CM

## 2024-05-02 NOTE — Patient Instructions (Signed)
 Medication Instructions:  Your physician recommends that you continue on your current medications as directed. Please refer to the Current Medication list given to you today.  *If you need a refill on your cardiac medications before your next appointment, please call your pharmacy*  Lab Work: In March: fasting NMR, Hgb A1c, Apo B, LP(a) If you have labs (blood work) drawn today and your tests are completely normal, you will receive your results only by: MyChart Message (if you have MyChart) OR A paper copy in the mail If you have any lab test that is abnormal or we need to change your treatment, we will call you to review the results.  Follow-Up: At [facility], you and your health needs are our priority.  As part of our continuing mission to provide you with exceptional heart care, our providers are all part of one team.  This team includes your primary Cardiologist (physician) and Advanced Practice Providers or APPs (Physician Assistants and Nurse Practitioners) who all work together to provide you with the care you need, when you need it.  Your next appointment:   1 year(s)  Provider:   Vina Gull, MD  We recommend signing up for the patient portal called MyChart.  Sign up information is provided on this After Visit Summary.  MyChart is used to connect with patients for Virtual Visits (Telemedicine).  Patients are able to view lab/test results, encounter notes, upcoming appointments, etc.  Non-urgent messages can be sent to your provider as well.    To learn more about what you can do with MyChart, go to [email or web address].

## 2024-05-27 ENCOUNTER — Telehealth: Payer: Self-pay | Admitting: Internal Medicine

## 2024-05-27 NOTE — Telephone Encounter (Signed)
 Patient wants to know if Dr. Okey can get her a handicap placard, she states that she is getting older and parking far away is getting hard on her. Please advise.

## 2024-05-27 NOTE — Telephone Encounter (Signed)
 Spoke with patient who reports she is getting older and would like a handicap placard.  She states some times when she has to park far away from building she gets tired.  She denies the need for assistance with walking or the use of supplemental oxygen.  Advised I will forward this to Dr Okey and her nurse for review.

## 2024-07-08 ENCOUNTER — Encounter: Payer: Self-pay | Admitting: Radiology

## 2024-08-02 ENCOUNTER — Encounter: Payer: Self-pay | Admitting: Emergency Medicine

## 2024-08-02 ENCOUNTER — Ambulatory Visit
Admission: EM | Admit: 2024-08-02 | Discharge: 2024-08-02 | Disposition: A | Discharge status: Home / Self Care | Payer: Self-pay

## 2024-08-02 DIAGNOSIS — W540XXA Bitten by dog, initial encounter: Secondary | ICD-10-CM

## 2024-08-02 DIAGNOSIS — S81851A Open bite, right lower leg, initial encounter: Secondary | ICD-10-CM

## 2024-08-02 DIAGNOSIS — E785 Hyperlipidemia, unspecified: Secondary | ICD-10-CM | POA: Insufficient documentation

## 2024-08-02 DIAGNOSIS — F411 Generalized anxiety disorder: Secondary | ICD-10-CM

## 2024-08-02 DIAGNOSIS — E669 Obesity, unspecified: Secondary | ICD-10-CM | POA: Insufficient documentation

## 2024-08-02 DIAGNOSIS — M810 Age-related osteoporosis without current pathological fracture: Secondary | ICD-10-CM | POA: Insufficient documentation

## 2024-08-02 DIAGNOSIS — Z87898 Personal history of other specified conditions: Secondary | ICD-10-CM | POA: Insufficient documentation

## 2024-08-02 MED ORDER — AMOXICILLIN-POT CLAVULANATE 875-125 MG PO TABS: 1.0000 | ORAL_TABLET | Freq: Two times a day (BID) | ORAL | 0 refills | Status: AC

## 2024-08-02 MED ORDER — TETANUS-DIPHTH-ACELL PERTUSSIS 5-2-15.5 LF-MCG/0.5 IM SUSP
0.5000 mL | Freq: Once | INTRAMUSCULAR | Status: AC
  Administered 2024-08-02: 0.5 mL via INTRAMUSCULAR

## 2024-08-02 MED ORDER — HYDROXYZINE HCL 25 MG PO TABS
25.0000 mg | ORAL_TABLET | Freq: Four times a day (QID) | ORAL | 0 refills | Status: AC
Start: 2024-08-02 — End: ?

## 2024-08-02 NOTE — Discharge Instructions (Signed)
  Be sure to follow-up with PCP for anxiety and depression, they may be able to offer you something more for symptoms.    Be sure to complete antibiotics, take pills until they are all gone.
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
## 2024-08-02 NOTE — ED Triage Notes (Signed)
 Pt reports dog bite that occurred on Tuesday (07/30/24). Bite area is swollen with bruising and has area of broken skin. Pt has cleaned the area with rubbing alcohol and applied neosporin.The bite occurred when patient walked into her florist - their dog in the shop bit her as soon as she opened the door. The florist provided vaccine records with rabies tag [phone], noting dog is UTD on vaccines. Pt has already called animal control. Pt states she needs something for her nerves as she is emotionally uneasy after dog bite and the recent loss of her brother (at florist to pick up his flowers).

## 2024-09-17 ENCOUNTER — Telehealth: Payer: Self-pay | Admitting: Orthopaedic Surgery

## 2024-09-17 NOTE — Telephone Encounter (Signed)
 Pt request handicap plaque form due to back pain

## 2024-09-17 NOTE — Telephone Encounter (Signed)
 Called and notified patient that placard application is up front for pick up.

## 2024-09-17 NOTE — Telephone Encounter (Signed)
Yes permanent
